# Patient Record
Sex: Male | Born: 1955 | Race: White | Hispanic: No | Marital: Married | Smoking: Never smoker
Health system: Southern US, Community
[De-identification: ages and names within clinical notes are randomized; demographics above are authoritative.]

## PROBLEM LIST (undated history)

## (undated) DIAGNOSIS — I10 Essential (primary) hypertension: Secondary | ICD-10-CM

## (undated) DIAGNOSIS — I872 Venous insufficiency (chronic) (peripheral): Secondary | ICD-10-CM

## (undated) DIAGNOSIS — T7840XA Allergy, unspecified, initial encounter: Secondary | ICD-10-CM

## (undated) DIAGNOSIS — E669 Obesity, unspecified: Secondary | ICD-10-CM

## (undated) HISTORY — DX: Essential (primary) hypertension: I10

## (undated) HISTORY — DX: Venous insufficiency (chronic) (peripheral): I87.2

## (undated) HISTORY — DX: Allergy, unspecified, initial encounter: T78.40XA

## (undated) HISTORY — DX: Obesity, unspecified: E66.9

---

## 2004-05-21 ENCOUNTER — Ambulatory Visit (HOSPITAL_COMMUNITY): Admission: RE | Admit: 2004-05-21 | Discharge: 2004-05-21 | Payer: Self-pay | Admitting: *Deleted

## 2006-09-27 IMAGING — CT CT ANGIO CHEST
4 of 8 series · 15 of 32 positions shown · IV contrast (120 ML OMNI 300)
Comparison: none

CLINICAL DATA: Recent echocardiogram showing questionable linear density in the aorta.  Episode of shortness of breath and diaphoresis.
 CT ANGIOGRAM OF THE CHEST:
TECHNIQUE: Multidetector CT imaging of the chest was performed according to the protocol for detection of pulmonary embolism during IV bolus injection of 120 ml Omnipaque 300 IV.  Coronal and sagittal plane reformatted images were also generated.

[Series 5: aortic disection · axial · 0.82mm/px · z∈[-230,-134]mm · 3 of 115 slices shown (1 of 2)]
[im 39/115  lung]
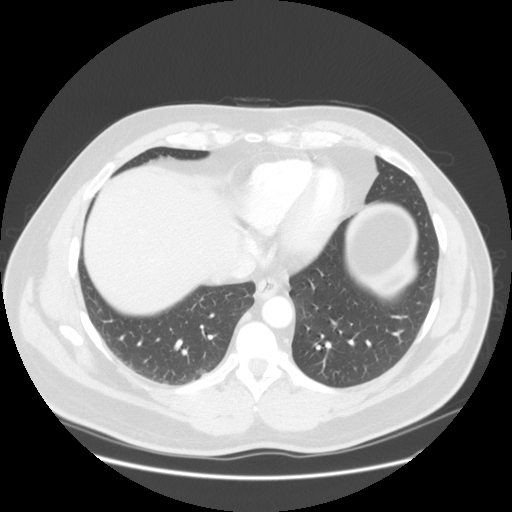
[im 54/115  lung]
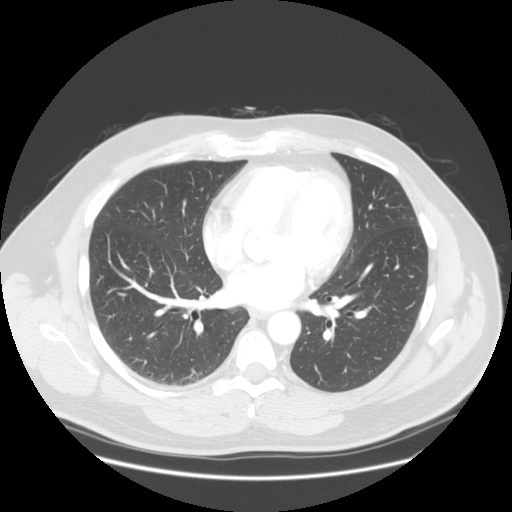
[im 77/115  lung]
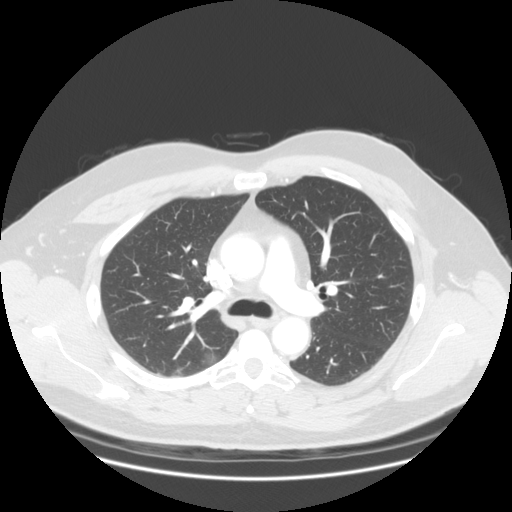

[Series 6: recon 2: aortic disection · axial · 0.70mm/px · z∈[-192,-150]mm · 2 of 89 slices shown]
[im 28/89  lung]
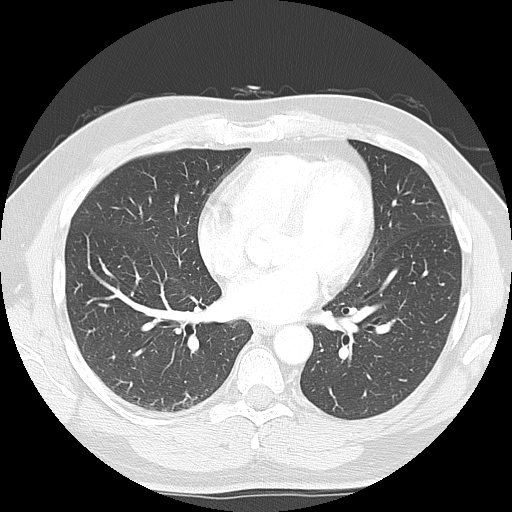
[im 45/89  lung]
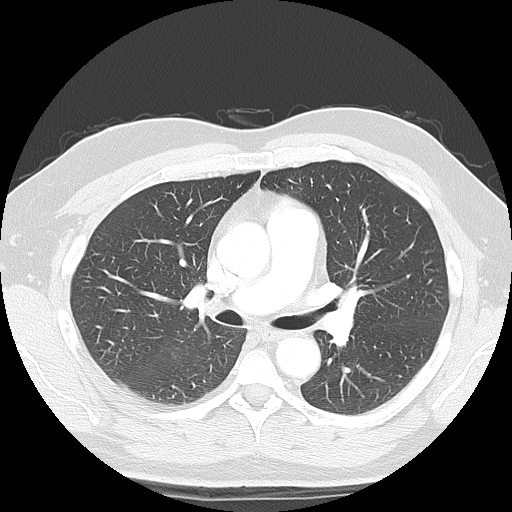

[Series 105: aortic disection · axial · 0.82mm/px · z∈[-286,-81]mm · 7 of 230 slices shown (2 of 2)]
[im 33/230  lung]
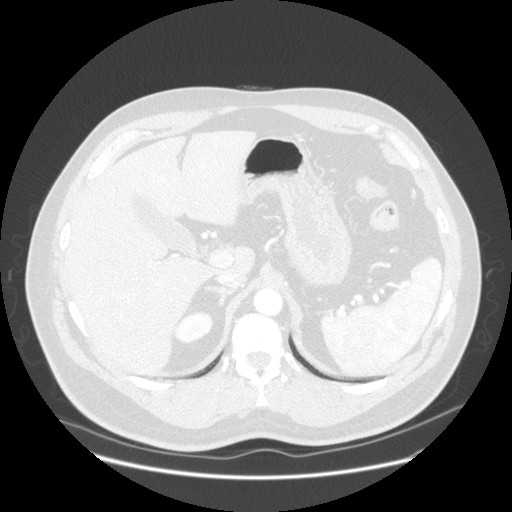
[im 66/230  mediastinal]
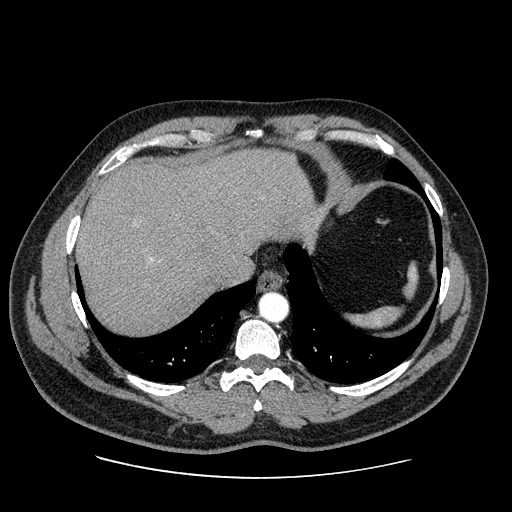
[im 99/230  lung]
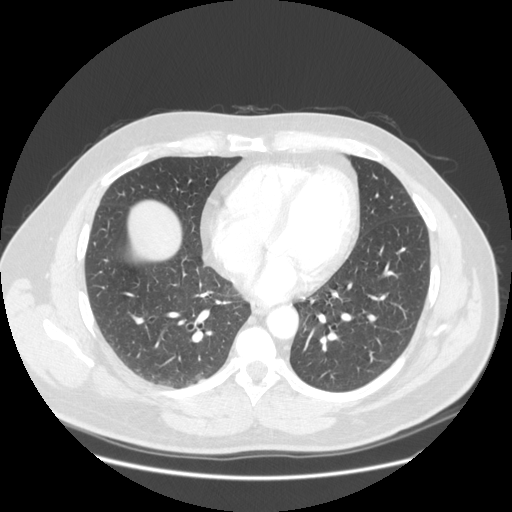
[im 108/230  mediastinal]
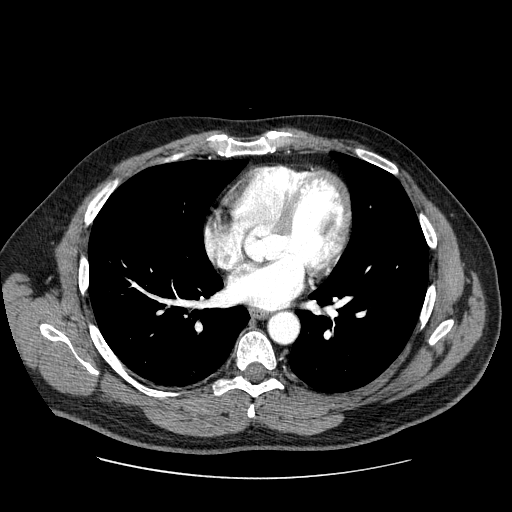
[im 131/230  lung]
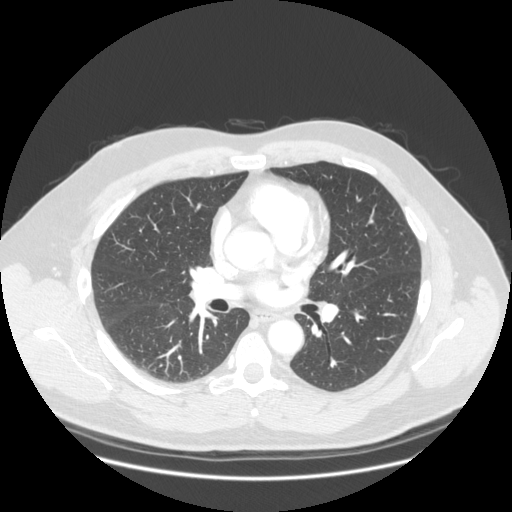
[im 164/230  mediastinal]
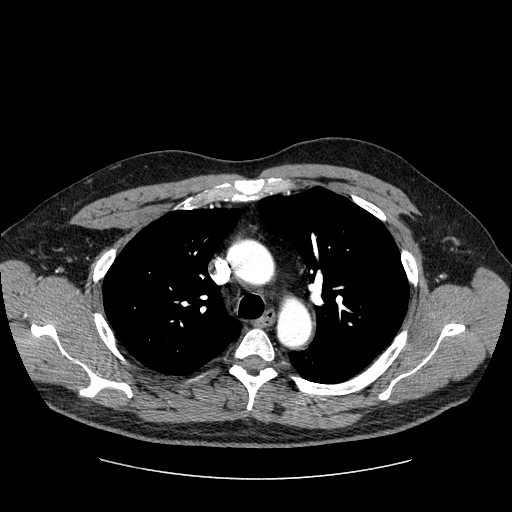
[im 197/230  lung]
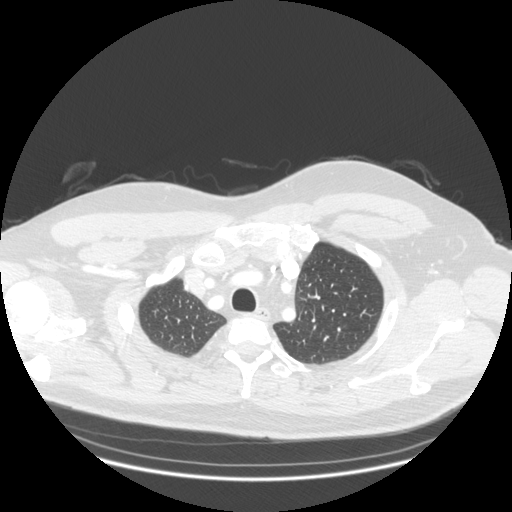

[Series 106: reformatted · sagittal · 0.47mm/px · 3 of 131 slices shown]
[im 33/131  lung]
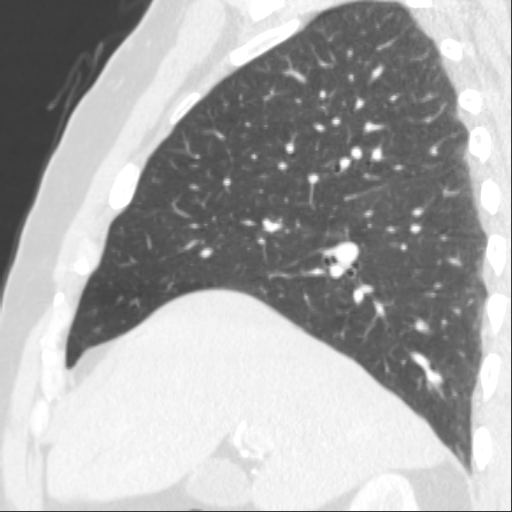
[im 66/131  lung]
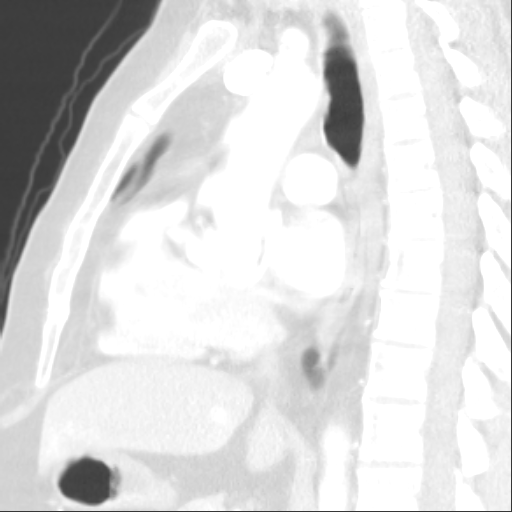
[im 98/131  lung]
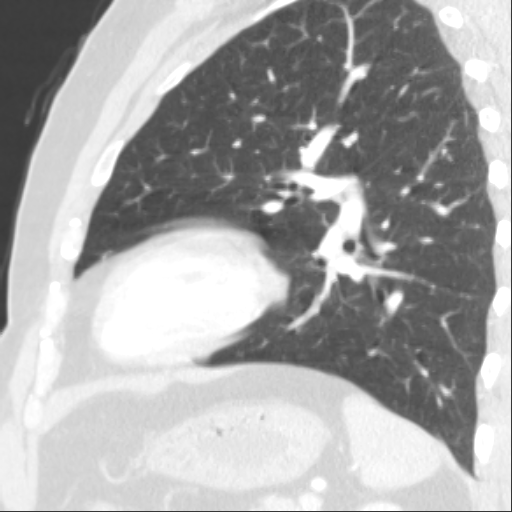

[15 of 32 positions shown; findings below may reference images not displayed]

FINDINGS: the lungs are clear.  No pleural or pericardial fluid.  No mediastinal or hilar adenopathy.  The aorta has a normal appearance without evidence of dissection.  No atherosclerotic change or aneurysm.  No abnormality of the pulmonary arterial tree.  Scans in the upper abdomen do not show any pathologic finding.
IMPRESSION: Negative CT angiogram of the chest.  No evidence of aortic pathology.  See above for full report.

## 2013-08-06 ENCOUNTER — Telehealth: Payer: Self-pay | Admitting: Internal Medicine

## 2013-10-21 ENCOUNTER — Telehealth: Payer: Self-pay

## 2013-10-21 NOTE — Telephone Encounter (Signed)
LM on his home VM to call me back.  Recall put into system.

## 2013-10-21 NOTE — Telephone Encounter (Signed)
Message copied by [place], Carloyn Lahue E on Thu Oct 21, 2013  4:25 PM ------      Message from: Silvano Rusk E      Created: Thu Oct 21, 2013  2:17 PM      Regarding: next colonoscopy due 07/2018       Please call patient and tell him I am sorry for dealy but good news - have reviewed his records and according to 2015 guidelines he does not need a colonoscopy again until 5/202 UNLESS he is having bleeding, changes in bowels, anemia, etc.            Please cc his PCP a copy of phone note after you call      Place 5./2020 recall            Thanks ------

## 2013-10-26 NOTE — Telephone Encounter (Signed)
Left message to call me back on his voice mail # [phone].

## 2013-11-01 NOTE — Telephone Encounter (Signed)
Left message to call me back on his mobile #, called work # and they said he was not in today.

## 2013-11-03 NOTE — Telephone Encounter (Signed)
Unable to reach patient after 3 try's so will mail him letter with the information Dr. Carlean Purl would like to inform him of.  Patient doesn't have a PCP listed to forward this information to.

## 2016-12-30 ENCOUNTER — Ambulatory Visit: Payer: Self-pay | Admitting: *Deleted

## 2016-12-30 VITALS — BP 157/95 | Ht 69.0 in | Wt 222.0 lb

## 2016-12-30 DIAGNOSIS — Z0000 Encounter for general adult medical examination without abnormal findings: Secondary | ICD-10-CM

## 2016-12-30 NOTE — Progress Notes (Signed)
Pt presented with labs drawn at pcp for be well. No Ht/Wt or BP documented. Obtained these in clinic today. BP elevated. Pt reports it typically runs 135-140 sys in MD office. No A1c drawn with labs. Due to BP out of passing range for [facility], A1c drawn today in clinic. Remainder of labs drawn at PCP in paper chart. Will call pt with results tomorrow.  [facility] insurance premium discount evaluation: Replacements ROI form signed. Tobacco Free Attestation form signed.  Forms placed in paper chart.

## 2016-12-31 LAB — HGB A1C W/O EAG: Hgb A1c MFr Bld: 5.4 % (ref 4.8–5.6)

## 2016-12-31 NOTE — Progress Notes (Signed)
Result reviewed with pt via email. DASH diet discussed at lab draw. F/u with pcp as scheduled.

## 2018-06-18 NOTE — Progress Notes (Signed)
noted 

## 2018-06-18 NOTE — Progress Notes (Signed)
Jackson Hinders, MD at [facility] is pcp. Annual labs performed through them and used for [facility]. Will request late 2019 lab results from pt for upcoming due [facility] for 2020.

## 2018-08-06 ENCOUNTER — Encounter: Payer: Self-pay | Admitting: Internal Medicine

## 2019-08-13 ENCOUNTER — Other Ambulatory Visit: Payer: Self-pay

## 2019-08-13 ENCOUNTER — Ambulatory Visit: Payer: No Typology Code available for payment source | Admitting: Internal Medicine

## 2019-08-13 ENCOUNTER — Encounter: Payer: Self-pay | Admitting: Internal Medicine

## 2019-08-13 VITALS — BP 120/70 | HR 83 | Temp 98.4°F | Ht 68.0 in | Wt 251.7 lb

## 2019-08-13 DIAGNOSIS — I872 Venous insufficiency (chronic) (peripheral): Secondary | ICD-10-CM | POA: Insufficient documentation

## 2019-08-13 DIAGNOSIS — Z1211 Encounter for screening for malignant neoplasm of colon: Secondary | ICD-10-CM

## 2019-08-13 DIAGNOSIS — E669 Obesity, unspecified: Secondary | ICD-10-CM | POA: Insufficient documentation

## 2019-08-13 DIAGNOSIS — I10 Essential (primary) hypertension: Secondary | ICD-10-CM | POA: Insufficient documentation

## 2019-08-13 MED ORDER — LOSARTAN POTASSIUM-HCTZ 100-25 MG PO TABS: 1.0000 | ORAL_TABLET | Freq: Every day | ORAL | 1 refills | Status: DC

## 2019-08-13 NOTE — Progress Notes (Signed)
New Patient Office Visit     This visit occurred during the SARS-CoV-2 public health emergency.  Safety protocols were in place, including screening questions prior to the visit, additional usage of staff PPE, and extensive cleaning of exam room while observing appropriate contact time as indicated for disinfecting solutions.    CC/Reason for Visit: Establish care, discuss chronic conditions, medication refills Previous PCP: Trilby Drummer Last Visit: 2 years ago  HPI: Jackson Alexander is a 64 y.o. male who is coming in today for the above mentioned reasons. Past Medical History is significant for: Well-controlled hypertension, chronic venous insufficiency and obesity.  He is here today to establish care.  He does not smoke, he drinks alcohol occasionally, no drugs, he is a call center supervisor, he is married to a man, he has no known drug allergies.  Past history, family history significant for maternal grandmother with breast cancer, maternal grandfather with prostate cancer, maternal aunt with breast cancer, maternal uncle with renal cell cancer, father was a smoker and had lung cancer, one brother had melanoma and another brother had renal cell cancer, 3 sisters had breast and uterine cancer.  No history of heart disease or stroke.  He would like me to evaluate a spot in his right leg.   Past Medical/Surgical History: Past Medical History:  Diagnosis Date  . Chronic venous insufficiency   . Hypertension   . Obesity (BMI 30-39.9)     History reviewed. No pertinent surgical history.  Social History:  reports that he has never smoked. He has never used smokeless tobacco. He reports current alcohol use. He reports that he does not use drugs.  Allergies: No Known Allergies  Family History:  Family History  Problem Relation Age of Onset  . Lung cancer Father   . Breast cancer Sister   . Renal cancer Brother   . Breast cancer Maternal Grandmother   . Prostate cancer Maternal  Grandfather   . Breast cancer Maternal Aunt   . Renal cancer Maternal Uncle   . Melanoma Brother   . Breast cancer Sister   . Breast cancer Sister      Current Outpatient Medications:  .  fexofenadine (ALLEGRA) 180 MG tablet, Take 180 mg by mouth daily., Disp: , Rfl:  .  losartan-hydrochlorothiazide (HYZAAR) 100-25 MG tablet, Take 1 tablet by mouth daily., Disp: 90 tablet, Rfl: 1 .  OVER THE COUNTER MEDICATION, Vitamin D3 50 mcg 2,000 iu per day, Disp: , Rfl:   Review of Systems:  Constitutional: Denies fever, chills, diaphoresis, appetite change and fatigue.  HEENT: Denies photophobia, eye pain, redness, hearing loss, ear pain, congestion, sore throat, rhinorrhea, sneezing, mouth sores, trouble swallowing, neck pain, neck stiffness and tinnitus.   Respiratory: Denies SOB, DOE, cough, chest tightness,  and wheezing.   Cardiovascular: Denies chest pain, palpitations and leg swelling.  Gastrointestinal: Denies nausea, vomiting, abdominal pain, diarrhea, constipation, blood in stool and abdominal distention.  Genitourinary: Denies dysuria, urgency, frequency, hematuria, flank pain and difficulty urinating.  Endocrine: Denies: hot or cold intolerance, sweats, changes in hair or nails, polyuria, polydipsia. Musculoskeletal: Denies myalgias, back pain, joint swelling, arthralgias and gait problem.  Skin: Denies pallor, rash and wound.  Neurological: Denies dizziness, seizures, syncope, weakness, light-headedness, numbness and headaches.  Hematological: Denies adenopathy. Easy bruising, personal or family bleeding history  Psychiatric/Behavioral: Denies suicidal ideation, mood changes, confusion, nervousness, sleep disturbance and agitation    Physical Exam: Vitals:   08/13/19 1118  BP: 120/70  Pulse: 83  Temp: 98.4 F (36.9 C)  TempSrc: Temporal  SpO2: 97%  Weight: 251 lb 11.2 oz (114.2 kg)  Height: 5\' 8"  (1.727 m)   Body mass index is 38.27 kg/m.  Constitutional: NAD, calm,  comfortable Eyes: PERRL, lids and conjunctivae normal ENMT: Mucous membranes are moist.  Respiratory: clear to auscultation bilaterally, no wheezing, no crackles. Normal respiratory effort. No accessory muscle use.  Cardiovascular: Regular rate and rhythm, no murmurs / rubs / gallops.  2+ pitting lower extremity edema. 2+ pedal pulses. Skin: Chronic lower extremity venous insufficiency skin color changes with an early excoriation on the outer right lower shin. Neurologic: Grossly intact and nonfocal Psychiatric: Normal judgment and insight. Alert and oriented x 3. Normal mood.    Impression and Plan:  Screening for malignant neoplasm of colon  - Plan: Ambulatory referral to Gastroenterology  Essential hypertension -Well-controlled, -With significant lower extremity edema we have decided to discontinue amlodipine today. -He will do ambulatory blood pressure monitoring and return in 6 weeks for follow-up.    Obesity (BMI 30-39.9) -Discussed healthy lifestyle, including increased physical activity and better food choices to promote weight loss.  Chronic venous insufficiency -Have recommended compression stockings.    Patient Instructions  -Nice seeing you today!!  -Stop taking amlodipine.  -Check BP 2-3 times a week and bring measurements into your next visit.  -Schedule follow up in 6 weeks.   DASH Eating Plan DASH stands for "Dietary Approaches to Stop Hypertension." The DASH eating plan is a healthy eating plan that has been shown to reduce high blood pressure (hypertension). It may also reduce your risk for type 2 diabetes, heart disease, and stroke. The DASH eating plan may also help with weight loss. What are tips for following this plan?  General guidelines  Avoid eating more than 2,300 mg (milligrams) of salt (sodium) a day. If you have hypertension, you may need to reduce your sodium intake to 1,500 mg a day.  Limit alcohol intake to no more than 1 drink a day for  nonpregnant women and 2 drinks a day for men. One drink equals 12 oz of beer, 5 oz of wine, or 1 oz of hard liquor.  Work with your health care provider to maintain a healthy body weight or to lose weight. Ask what an ideal weight is for you.  Get at least 30 minutes of exercise that causes your heart to beat faster (aerobic exercise) most days of the week. Activities may include walking, swimming, or biking.  Work with your health care provider or diet and nutrition specialist (dietitian) to adjust your eating plan to your individual calorie needs. Reading food labels   Check food labels for the amount of sodium per serving. Choose foods with less than 5 percent of the Daily Value of sodium. Generally, foods with less than 300 mg of sodium per serving fit into this eating plan.  To find whole grains, look for the word "whole" as the first word in the ingredient list. Shopping  Buy products labeled as "low-sodium" or "no salt added."  Buy fresh foods. Avoid canned foods and premade or frozen meals. Cooking  Avoid adding salt when cooking. Use salt-free seasonings or herbs instead of table salt or sea salt. Check with your health care provider or pharmacist before using salt substitutes.  Do not fry foods. Cook foods using healthy methods such as baking, boiling, grilling, and broiling instead.  Cook with heart-healthy oils, such as olive, canola, soybean, or sunflower oil. Meal planning  Eat a balanced diet that includes: ? 5 or more servings of fruits and vegetables each day. At each meal, try to fill half of your plate with fruits and vegetables. ? Up to 6-8 servings of whole grains each day. ? Less than 6 oz of lean meat, poultry, or fish each day. A 3-oz serving of meat is about the same size as a deck of cards. One egg equals 1 oz. ? 2 servings of low-fat dairy each day. ? A serving of nuts, seeds, or beans 5 times each week. ? Heart-healthy fats. Healthy fats called Omega-3  fatty acids are found in foods such as flaxseeds and coldwater fish, like sardines, salmon, and mackerel.  Limit how much you eat of the following: ? Canned or prepackaged foods. ? Food that is high in trans fat, such as fried foods. ? Food that is high in saturated fat, such as fatty meat. ? Sweets, desserts, sugary drinks, and other foods with added sugar. ? Full-fat dairy products.  Do not salt foods before eating.  Try to eat at least 2 vegetarian meals each week.  Eat more home-cooked food and less restaurant, buffet, and fast food.  When eating at a restaurant, ask that your food be prepared with less salt or no salt, if possible. What foods are recommended? The items listed may not be a complete list. Talk with your dietitian about what dietary choices are best for you. Grains Whole-grain or whole-wheat bread. Whole-grain or whole-wheat pasta. Brown rice. [place]. Bulgur. Whole-grain and low-sodium cereals. Pita bread. Low-fat, low-sodium crackers. Whole-wheat flour tortillas. Vegetables Fresh or frozen vegetables (raw, steamed, roasted, or grilled). Low-sodium or reduced-sodium tomato and vegetable juice. Low-sodium or reduced-sodium tomato sauce and tomato paste. Low-sodium or reduced-sodium canned vegetables. Fruits All fresh, dried, or frozen fruit. Canned fruit in natural juice (without added sugar). Meat and other protein foods Skinless chicken or [place]. Ground chicken or [place]. Pork with fat trimmed off. Fish and seafood. Egg whites. Dried beans, peas, or lentils. Unsalted nuts, nut butters, and seeds. Unsalted canned beans. Lean cuts of beef with fat trimmed off. Low-sodium, lean deli meat. Dairy Low-fat (1%) or fat-free (skim) milk. Fat-free, low-fat, or reduced-fat cheeses. Nonfat, low-sodium ricotta or cottage cheese. Low-fat or nonfat yogurt. Low-fat, low-sodium cheese. Fats and oils Soft margarine without trans fats. Vegetable oil. Low-fat, reduced-fat, or  light mayonnaise and salad dressings (reduced-sodium). Canola, safflower, olive, soybean, and sunflower oils. Avocado. Seasoning and other foods Herbs. Spices. Seasoning mixes without salt. Unsalted popcorn and pretzels. Fat-free sweets. What foods are not recommended? The items listed may not be a complete list. Talk with your dietitian about what dietary choices are best for you. Grains Baked goods made with fat, such as croissants, muffins, or some breads. Dry pasta or rice meal packs. Vegetables Creamed or fried vegetables. Vegetables in a cheese sauce. Regular canned vegetables (not low-sodium or reduced-sodium). Regular canned tomato sauce and paste (not low-sodium or reduced-sodium). Regular tomato and vegetable juice (not low-sodium or reduced-sodium). Angie Fava. Olives. Fruits Canned fruit in a light or heavy syrup. Fried fruit. Fruit in cream or butter sauce. Meat and other protein foods Fatty cuts of meat. Ribs. Fried meat. Berniece Salines. Sausage. Bologna and other processed lunch meats. Salami. Fatback. Hotdogs. Bratwurst. Salted nuts and seeds. Canned beans with added salt. Canned or smoked fish. Whole eggs or egg yolks. Chicken or [place] with skin. Dairy Whole or 2% milk, cream, and half-and-half. Whole or full-fat cream cheese. Whole-fat or sweetened yogurt.  Full-fat cheese. Nondairy creamers. Whipped toppings. Processed cheese and cheese spreads. Fats and oils Butter. Stick margarine. Lard. Shortening. Ghee. Bacon fat. Tropical oils, such as coconut, palm kernel, or palm oil. Seasoning and other foods Salted popcorn and pretzels. Onion salt, garlic salt, seasoned salt, table salt, and sea salt. Worcestershire sauce. Tartar sauce. Barbecue sauce. Teriyaki sauce. Soy sauce, including reduced-sodium. Steak sauce. Canned and packaged gravies. Fish sauce. Oyster sauce. Cocktail sauce. Horseradish that you find on the shelf. Ketchup. Mustard. Meat flavorings and tenderizers. Bouillon cubes. Hot  sauce and Tabasco sauce. Premade or packaged marinades. Premade or packaged taco seasonings. Relishes. Regular salad dressings. Where to find more information:  National Heart, Lung, and [place]: https://wilson-eaton.com/  American Heart Association: www.heart.org Summary  The DASH eating plan is a healthy eating plan that has been shown to reduce high blood pressure (hypertension). It may also reduce your risk for type 2 diabetes, heart disease, and stroke.  With the DASH eating plan, you should limit salt (sodium) intake to 2,300 mg a day. If you have hypertension, you may need to reduce your sodium intake to 1,500 mg a day.  When on the DASH eating plan, aim to eat more fresh fruits and vegetables, whole grains, lean proteins, low-fat dairy, and heart-healthy fats.  Work with your health care provider or diet and nutrition specialist (dietitian) to adjust your eating plan to your individual calorie needs. This information is not intended to replace advice given to you by your health care provider. Make sure you discuss any questions you have with your health care provider. Document Revised: 02/21/2017 Document Reviewed: 03/04/2016 Elsevier Patient Education  2020 [place], MD [facility]

## 2019-08-13 NOTE — Patient Instructions (Signed)
-Nice seeing you today!!  -Stop taking amlodipine.  -Check BP 2-3 times a week and bring measurements into your next visit.  -Schedule follow up in 6 weeks.   DASH Eating Plan DASH stands for "Dietary Approaches to Stop Hypertension." The DASH eating plan is a healthy eating plan that has been shown to reduce high blood pressure (hypertension). It may also reduce your risk for type 2 diabetes, heart disease, and stroke. The DASH eating plan may also help with weight loss. What are tips for following this plan?  General guidelines  Avoid eating more than 2,300 mg (milligrams) of salt (sodium) a day. If you have hypertension, you may need to reduce your sodium intake to 1,500 mg a day.  Limit alcohol intake to no more than 1 drink a day for nonpregnant women and 2 drinks a day for men. One drink equals 12 oz of beer, 5 oz of wine, or 1 oz of hard liquor.  Work with your health care provider to maintain a healthy body weight or to lose weight. Ask what an ideal weight is for you.  Get at least 30 minutes of exercise that causes your heart to beat faster (aerobic exercise) most days of the week. Activities may include walking, swimming, or biking.  Work with your health care provider or diet and nutrition specialist (dietitian) to adjust your eating plan to your individual calorie needs. Reading food labels   Check food labels for the amount of sodium per serving. Choose foods with less than 5 percent of the Daily Value of sodium. Generally, foods with less than 300 mg of sodium per serving fit into this eating plan.  To find whole grains, look for the word "whole" as the first word in the ingredient list. Shopping  Buy products labeled as "low-sodium" or "no salt added."  Buy fresh foods. Avoid canned foods and premade or frozen meals. Cooking  Avoid adding salt when cooking. Use salt-free seasonings or herbs instead of table salt or sea salt. Check with your health care provider  or pharmacist before using salt substitutes.  Do not fry foods. Cook foods using healthy methods such as baking, boiling, grilling, and broiling instead.  Cook with heart-healthy oils, such as olive, canola, soybean, or sunflower oil. Meal planning  Eat a balanced diet that includes: ? 5 or more servings of fruits and vegetables each day. At each meal, try to fill half of your plate with fruits and vegetables. ? Up to 6-8 servings of whole grains each day. ? Less than 6 oz of lean meat, poultry, or fish each day. A 3-oz serving of meat is about the same size as a deck of cards. One egg equals 1 oz. ? 2 servings of low-fat dairy each day. ? A serving of nuts, seeds, or beans 5 times each week. ? Heart-healthy fats. Healthy fats called Omega-3 fatty acids are found in foods such as flaxseeds and coldwater fish, like sardines, salmon, and mackerel.  Limit how much you eat of the following: ? Canned or prepackaged foods. ? Food that is high in trans fat, such as fried foods. ? Food that is high in saturated fat, such as fatty meat. ? Sweets, desserts, sugary drinks, and other foods with added sugar. ? Full-fat dairy products.  Do not salt foods before eating.  Try to eat at least 2 vegetarian meals each week.  Eat more home-cooked food and less restaurant, buffet, and fast food.  When eating at a restaurant, ask  that your food be prepared with less salt or no salt, if possible. What foods are recommended? The items listed may not be a complete list. Talk with your dietitian about what dietary choices are best for you. Grains Whole-grain or whole-wheat bread. Whole-grain or whole-wheat pasta. Brown rice. [place]. Bulgur. Whole-grain and low-sodium cereals. Pita bread. Low-fat, low-sodium crackers. Whole-wheat flour tortillas. Vegetables Fresh or frozen vegetables (raw, steamed, roasted, or grilled). Low-sodium or reduced-sodium tomato and vegetable juice. Low-sodium or  reduced-sodium tomato sauce and tomato paste. Low-sodium or reduced-sodium canned vegetables. Fruits All fresh, dried, or frozen fruit. Canned fruit in natural juice (without added sugar). Meat and other protein foods Skinless chicken or [place]. Ground chicken or [place]. Pork with fat trimmed off. Fish and seafood. Egg whites. Dried beans, peas, or lentils. Unsalted nuts, nut butters, and seeds. Unsalted canned beans. Lean cuts of beef with fat trimmed off. Low-sodium, lean deli meat. Dairy Low-fat (1%) or fat-free (skim) milk. Fat-free, low-fat, or reduced-fat cheeses. Nonfat, low-sodium ricotta or cottage cheese. Low-fat or nonfat yogurt. Low-fat, low-sodium cheese. Fats and oils Soft margarine without trans fats. Vegetable oil. Low-fat, reduced-fat, or light mayonnaise and salad dressings (reduced-sodium). Canola, safflower, olive, soybean, and sunflower oils. Avocado. Seasoning and other foods Herbs. Spices. Seasoning mixes without salt. Unsalted popcorn and pretzels. Fat-free sweets. What foods are not recommended? The items listed may not be a complete list. Talk with your dietitian about what dietary choices are best for you. Grains Baked goods made with fat, such as croissants, muffins, or some breads. Dry pasta or rice meal packs. Vegetables Creamed or fried vegetables. Vegetables in a cheese sauce. Regular canned vegetables (not low-sodium or reduced-sodium). Regular canned tomato sauce and paste (not low-sodium or reduced-sodium). Regular tomato and vegetable juice (not low-sodium or reduced-sodium). Angie Fava. Olives. Fruits Canned fruit in a light or heavy syrup. Fried fruit. Fruit in cream or butter sauce. Meat and other protein foods Fatty cuts of meat. Ribs. Fried meat. Berniece Salines. Sausage. Bologna and other processed lunch meats. Salami. Fatback. Hotdogs. Bratwurst. Salted nuts and seeds. Canned beans with added salt. Canned or smoked fish. Whole eggs or egg yolks. Chicken or [place]  with skin. Dairy Whole or 2% milk, cream, and half-and-half. Whole or full-fat cream cheese. Whole-fat or sweetened yogurt. Full-fat cheese. Nondairy creamers. Whipped toppings. Processed cheese and cheese spreads. Fats and oils Butter. Stick margarine. Lard. Shortening. Ghee. Bacon fat. Tropical oils, such as coconut, palm kernel, or palm oil. Seasoning and other foods Salted popcorn and pretzels. Onion salt, garlic salt, seasoned salt, table salt, and sea salt. Worcestershire sauce. Tartar sauce. Barbecue sauce. Teriyaki sauce. Soy sauce, including reduced-sodium. Steak sauce. Canned and packaged gravies. Fish sauce. Oyster sauce. Cocktail sauce. Horseradish that you find on the shelf. Ketchup. Mustard. Meat flavorings and tenderizers. Bouillon cubes. Hot sauce and Tabasco sauce. Premade or packaged marinades. Premade or packaged taco seasonings. Relishes. Regular salad dressings. Where to find more information:  National Heart, Lung, and [place]: https://wilson-eaton.com/  American Heart Association: www.heart.org Summary  The DASH eating plan is a healthy eating plan that has been shown to reduce high blood pressure (hypertension). It may also reduce your risk for type 2 diabetes, heart disease, and stroke.  With the DASH eating plan, you should limit salt (sodium) intake to 2,300 mg a day. If you have hypertension, you may need to reduce your sodium intake to 1,500 mg a day.  When on the DASH eating plan, aim to eat more fresh fruits  and vegetables, whole grains, lean proteins, low-fat dairy, and heart-healthy fats.  Work with your health care provider or diet and nutrition specialist (dietitian) to adjust your eating plan to your individual calorie needs. This information is not intended to replace advice given to you by your health care provider. Make sure you discuss any questions you have with your health care provider. Document Revised: 02/21/2017 Document Reviewed:  03/04/2016 Elsevier Patient Education  2020 [employer].

## 2019-09-16 ENCOUNTER — Encounter: Payer: Self-pay | Admitting: Internal Medicine

## 2019-10-06 ENCOUNTER — Encounter: Payer: Self-pay | Admitting: Internal Medicine

## 2019-10-06 ENCOUNTER — Other Ambulatory Visit: Payer: Self-pay

## 2019-10-06 ENCOUNTER — Ambulatory Visit (INDEPENDENT_AMBULATORY_CARE_PROVIDER_SITE_OTHER): Payer: PRIVATE HEALTH INSURANCE | Admitting: Internal Medicine

## 2019-10-06 VITALS — BP 150/90 | HR 83 | Temp 98.3°F | Ht 69.5 in | Wt 245.7 lb

## 2019-10-06 DIAGNOSIS — Z0000 Encounter for general adult medical examination without abnormal findings: Secondary | ICD-10-CM | POA: Diagnosis not present

## 2019-10-06 DIAGNOSIS — E669 Obesity, unspecified: Secondary | ICD-10-CM

## 2019-10-06 DIAGNOSIS — Z23 Encounter for immunization: Secondary | ICD-10-CM

## 2019-10-06 DIAGNOSIS — I10 Essential (primary) hypertension: Secondary | ICD-10-CM | POA: Diagnosis not present

## 2019-10-06 DIAGNOSIS — I872 Venous insufficiency (chronic) (peripheral): Secondary | ICD-10-CM | POA: Diagnosis not present

## 2019-10-06 MED ORDER — METOPROLOL TARTRATE 25 MG PO TABS: 25.0000 mg | ORAL_TABLET | Freq: Two times a day (BID) | ORAL | 1 refills | Status: DC

## 2019-10-06 NOTE — Addendum Note (Signed)
Addended by: Westley Hummer B on: 10/06/2019 03:59 PM   Modules accepted: Orders

## 2019-10-06 NOTE — Progress Notes (Signed)
Established Patient Office Visit     This visit occurred during the SARS-CoV-2 public health emergency.  Safety protocols were in place, including screening questions prior to the visit, additional usage of staff PPE, and extensive cleaning of exam room while observing appropriate contact time as indicated for disinfecting solutions.    CC/Reason for Visit: Annual preventive exam, blood pressure follow-up  HPI: Jackson Alexander is a 64 y.o. male who is coming in today for the above mentioned reasons. Past Medical History is significant for: Obesity, hypertension and chronic venous insufficiency.  At his last visit his amlodipine was discontinued due to lower extremity edema.  Blood pressure is elevated in office today to 150/90, however edema has completely resolved.  He does not have routine eye or dental care.  He does not exercise routinely.  He is due for his tetanus booster and shingles vaccination today.  He has his colonoscopy scheduled for August.  He has no complaints today.   Past Medical/Surgical History: Past Medical History:  Diagnosis Date  . Chronic venous insufficiency   . Hypertension   . Obesity (BMI 30-39.9)     No past surgical history on file.  Social History:  reports that he has never smoked. He has never used smokeless tobacco. He reports current alcohol use. He reports that he does not use drugs.  Allergies: No Known Allergies  Family History:  Family History  Problem Relation Age of Onset  . Lung cancer Father   . Breast cancer Sister   . Renal cancer Brother   . Breast cancer Maternal Grandmother   . Prostate cancer Maternal Grandfather   . Breast cancer Maternal Aunt   . Renal cancer Maternal Uncle   . Melanoma Brother   . Breast cancer Sister   . Breast cancer Sister      Current Outpatient Medications:  .  fexofenadine (ALLEGRA) 180 MG tablet, Take 180 mg by mouth daily., Disp: , Rfl:  .  losartan-hydrochlorothiazide (HYZAAR)  100-25 MG tablet, Take 1 tablet by mouth daily., Disp: 90 tablet, Rfl: 1 .  OVER THE COUNTER MEDICATION, Vitamin D3 50 mcg 2,000 iu per day, Disp: , Rfl:  .  metoprolol tartrate (LOPRESSOR) 25 MG tablet, Take 1 tablet (25 mg total) by mouth 2 (two) times daily., Disp: 180 tablet, Rfl: 1  Review of Systems:  Constitutional: Denies fever, chills, diaphoresis, appetite change and fatigue.  HEENT: Denies photophobia, eye pain, redness, hearing loss, ear pain, congestion, sore throat, rhinorrhea, sneezing, mouth sores, trouble swallowing, neck pain, neck stiffness and tinnitus.   Respiratory: Denies SOB, DOE, cough, chest tightness,  and wheezing.   Cardiovascular: Denies chest pain, palpitations and leg swelling.  Gastrointestinal: Denies nausea, vomiting, abdominal pain, diarrhea, constipation, blood in stool and abdominal distention.  Genitourinary: Denies dysuria, urgency, frequency, hematuria, flank pain and difficulty urinating.  Endocrine: Denies: hot or cold intolerance, sweats, changes in hair or nails, polyuria, polydipsia. Musculoskeletal: Denies myalgias, back pain, joint swelling, arthralgias and gait problem.  Skin: Denies pallor, rash and wound.  Neurological: Denies dizziness, seizures, syncope, weakness, light-headedness, numbness and headaches.  Hematological: Denies adenopathy. Easy bruising, personal or family bleeding history  Psychiatric/Behavioral: Denies suicidal ideation, mood changes, confusion, nervousness, sleep disturbance and agitation    Physical Exam: Vitals:   10/06/19 0756  BP: (!) 150/90  Pulse: 83  Temp: 98.3 F (36.8 C)  TempSrc: Temporal  SpO2: 94%  Weight: 245 lb 11.2 oz (111.4 kg)  Height: 5' 9.5" (1.765  m)    Body mass index is 35.76 kg/m.   Constitutional: NAD, calm, comfortable Eyes: PERRL, lids and conjunctivae normal ENMT: Mucous membranes are moist. Tympanic membrane is pearly white, no erythema or bulging. Neck: normal, supple, no  masses, no thyromegaly Respiratory: clear to auscultation bilaterally, no wheezing, no crackles. Normal respiratory effort. No accessory muscle use.  Cardiovascular: Regular rate and rhythm, no murmurs / rubs / gallops. No extremity edema. 2+ pedal pulses. No carotid bruits.  Abdomen: no tenderness, no masses palpated. No hepatosplenomegaly. Bowel sounds positive.  Musculoskeletal: no clubbing / cyanosis. No joint deformity upper and lower extremities. Good ROM, no contractures. Normal muscle tone.  Skin: Chronic venous insufficiency skin color changes of bilateral lower legs, worse on the right. Neurologic: CN 2-12 grossly intact. Sensation intact, DTR normal. Strength 5/5 in all 4.  Psychiatric: Normal judgment and insight. Alert and oriented x 3. Normal mood.    Impression and Plan:  Encounter for preventive health examination  -Advised routine eye and dental care. -For Tdap booster and first shingles vaccine today, otherwise immunizations are up-to-date. -Screening labs today. -Healthy lifestyle discussed in detail. -Scheduled for colonoscopy in August. -Check PSA today for prostate cancer screening.  Essential hypertension  -Not well controlled after discontinuation of amlodipine. -Continue losartan and hydrochlorothiazide at maximum doses. -Add metoprolol 25 mg twice daily. -Return in 6 weeks for follow-up and continue ambulatory blood pressure monitoring.  Obesity (BMI 30-39.9) -Discussed healthy lifestyle, including increased physical activity and better food choices to promote weight loss.  Chronic venous insufficiency -Compression stockings advised when standing/sitting for long periods of time.    Patient Instructions  -Nice seeing you today!!  -Lab work today; will notify you once results are available.  -Start metoprolol 25 mg twice daily.  -Check blood pressure at home 2-3 times per week and bring chart into your next visit.  -Schedule follow up in 6  weeks.  -Tetanus and first shingles vaccine today.  -Make sure you schedule your eye and dental exams.   Preventive Care 61-3 Years Old, Male Preventive care refers to lifestyle choices and visits with your health care provider that can promote health and wellness. This includes:  A yearly physical exam. This is also called an annual well check.  Regular dental and eye exams.  Immunizations.  Screening for certain conditions.  Healthy lifestyle choices, such as eating a healthy diet, getting regular exercise, not using drugs or products that contain nicotine and tobacco, and limiting alcohol use. What can I expect for my preventive care visit? Physical exam Your health care provider will check:  Height and weight. These may be used to calculate body mass index (BMI), which is a measurement that tells if you are at a healthy weight.  Heart rate and blood pressure.  Your skin for abnormal spots. Counseling Your health care provider may ask you questions about:  Alcohol, tobacco, and drug use.  Emotional well-being.  Home and relationship well-being.  Sexual activity.  Eating habits.  Work and work Statistician. What immunizations do I need?  Influenza (flu) vaccine  This is recommended every year. Tetanus, diphtheria, and pertussis (Tdap) vaccine  You may need a Td booster every 10 years. Varicella (chickenpox) vaccine  You may need this vaccine if you have not already been vaccinated. Zoster (shingles) vaccine  You may need this after age 57. Measles, mumps, and rubella (MMR) vaccine  You may need at least one dose of MMR if you were born in 1957 or  later. You may also need a second dose. Pneumococcal conjugate (PCV13) vaccine  You may need this if you have certain conditions and were not previously vaccinated. Pneumococcal polysaccharide (PPSV23) vaccine  You may need one or two doses if you smoke cigarettes or if you have certain  conditions. Meningococcal conjugate (MenACWY) vaccine  You may need this if you have certain conditions. Hepatitis A vaccine  You may need this if you have certain conditions or if you travel or work in places where you may be exposed to hepatitis A. Hepatitis B vaccine  You may need this if you have certain conditions or if you travel or work in places where you may be exposed to hepatitis B. Haemophilus influenzae type b (Hib) vaccine  You may need this if you have certain risk factors. Human papillomavirus (HPV) vaccine  If recommended by your health care provider, you may need three doses over 6 months. You may receive vaccines as individual doses or as more than one vaccine together in one shot (combination vaccines). Talk with your health care provider about the risks and benefits of combination vaccines. What tests do I need? Blood tests  Lipid and cholesterol levels. These may be checked every 5 years, or more frequently if you are over 64 years old.  Hepatitis C test.  Hepatitis B test. Screening  Lung cancer screening. You may have this screening every year starting at age 53 if you have a 30-pack-year history of smoking and currently smoke or have quit within the past 15 years.  Prostate cancer screening. Recommendations will vary depending on your family history and other risks.  Colorectal cancer screening. All adults should have this screening starting at age 55 and continuing until age 59. Your health care provider may recommend screening at age 22 if you are at increased risk. You will have tests every 1-10 years, depending on your results and the type of screening test.  Diabetes screening. This is done by checking your blood sugar (glucose) after you have not eaten for a while (fasting). You may have this done every 1-3 years.  Sexually transmitted disease (STD) testing. Follow these instructions at home: Eating and drinking  Eat a diet that includes fresh  fruits and vegetables, whole grains, lean protein, and low-fat dairy products.  Take vitamin and mineral supplements as recommended by your health care provider.  Do not drink alcohol if your health care provider tells you not to drink.  If you drink alcohol: ? Limit how much you have to 0-2 drinks a day. ? Be aware of how much alcohol is in your drink. In the [place] one drink equals one 12 oz bottle of beer (355 mL), one 5 oz glass of wine (148 mL), or one 1 oz glass of hard liquor (44 mL). Lifestyle  Take daily care of your teeth and gums.  Stay active. Exercise for at least 30 minutes on 5 or more days each week.  Do not use any products that contain nicotine or tobacco, such as cigarettes, e-cigarettes, and chewing tobacco. If you need help quitting, ask your health care provider.  If you are sexually active, practice safe sex. Use a condom or other form of protection to prevent STIs (sexually transmitted infections).  Talk with your health care provider about taking a low-dose aspirin every day starting at age 26. What's next?  Go to your health care provider once a year for a well check visit.  Ask your health care provider how often  you should have your eyes and teeth checked.  Stay up to date on all vaccines. This information is not intended to replace advice given to you by your health care provider. Make sure you discuss any questions you have with your health care provider. Document Revised: 03/05/2018 Document Reviewed: 03/05/2018 Elsevier Patient Education  2020 [place], [place] [facility]

## 2019-10-06 NOTE — Addendum Note (Signed)
Addended by: Marrion Coy on: 10/06/2019 08:33 AM   Modules accepted: Orders

## 2019-10-06 NOTE — Patient Instructions (Signed)
-Nice seeing you today!!  -Lab work today; will notify you once results are available.  -Start metoprolol 25 mg twice daily.  -Check blood pressure at home 2-3 times per week and bring chart into your next visit.  -Schedule follow up in 6 weeks.  -Tetanus and first shingles vaccine today.  -Make sure you schedule your eye and dental exams.   Preventive Care 64-64 Years Old, Male Preventive care refers to lifestyle choices and visits with your health care provider that can promote health and wellness. This includes:  A yearly physical exam. This is also called an annual well check.  Regular dental and eye exams.  Immunizations.  Screening for certain conditions.  Healthy lifestyle choices, such as eating a healthy diet, getting regular exercise, not using drugs or products that contain nicotine and tobacco, and limiting alcohol use. What can I expect for my preventive care visit? Physical exam Your health care provider will check:  Height and weight. These may be used to calculate body mass index (BMI), which is a measurement that tells if you are at a healthy weight.  Heart rate and blood pressure.  Your skin for abnormal spots. Counseling Your health care provider may ask you questions about:  Alcohol, tobacco, and drug use.  Emotional well-being.  Home and relationship well-being.  Sexual activity.  Eating habits.  Work and work Statistician. What immunizations do I need?  Influenza (flu) vaccine  This is recommended every year. Tetanus, diphtheria, and pertussis (Tdap) vaccine  You may need a Td booster every 10 years. Varicella (chickenpox) vaccine  You may need this vaccine if you have not already been vaccinated. Zoster (shingles) vaccine  You may need this after age 64. Measles, mumps, and rubella (MMR) vaccine  You may need at least one dose of MMR if you were born in 1957 or later. You may also need a second dose. Pneumococcal conjugate  (PCV13) vaccine  You may need this if you have certain conditions and were not previously vaccinated. Pneumococcal polysaccharide (PPSV23) vaccine  You may need one or two doses if you smoke cigarettes or if you have certain conditions. Meningococcal conjugate (MenACWY) vaccine  You may need this if you have certain conditions. Hepatitis A vaccine  You may need this if you have certain conditions or if you travel or work in places where you may be exposed to hepatitis A. Hepatitis B vaccine  You may need this if you have certain conditions or if you travel or work in places where you may be exposed to hepatitis B. Haemophilus influenzae type b (Hib) vaccine  You may need this if you have certain risk factors. Human papillomavirus (HPV) vaccine  If recommended by your health care provider, you may need three doses over 6 months. You may receive vaccines as individual doses or as more than one vaccine together in one shot (combination vaccines). Talk with your health care provider about the risks and benefits of combination vaccines. What tests do I need? Blood tests  Lipid and cholesterol levels. These may be checked every 5 years, or more frequently if you are over 64 years old.  Hepatitis C test.  Hepatitis B test. Screening  Lung cancer screening. You may have this screening every year starting at age 64 if you have a 30-pack-year history of smoking and currently smoke or have quit within the past 15 years.  Prostate cancer screening. Recommendations will vary depending on your family history and other risks.  Colorectal cancer screening.  All adults should have this screening starting at age 36 and continuing until age 60. Your health care provider may recommend screening at age 57 if you are at increased risk. You will have tests every 1-10 years, depending on your results and the type of screening test.  Diabetes screening. This is done by checking your blood sugar (glucose)  after you have not eaten for a while (fasting). You may have this done every 1-3 years.  Sexually transmitted disease (STD) testing. Follow these instructions at home: Eating and drinking  Eat a diet that includes fresh fruits and vegetables, whole grains, lean protein, and low-fat dairy products.  Take vitamin and mineral supplements as recommended by your health care provider.  Do not drink alcohol if your health care provider tells you not to drink.  If you drink alcohol: ? Limit how much you have to 0-2 drinks a day. ? Be aware of how much alcohol is in your drink. In the [place] one drink equals one 12 oz bottle of beer (355 mL), one 5 oz glass of wine (148 mL), or one 1 oz glass of hard liquor (44 mL). Lifestyle  Take daily care of your teeth and gums.  Stay active. Exercise for at least 30 minutes on 5 or more days each week.  Do not use any products that contain nicotine or tobacco, such as cigarettes, e-cigarettes, and chewing tobacco. If you need help quitting, ask your health care provider.  If you are sexually active, practice safe sex. Use a condom or other form of protection to prevent STIs (sexually transmitted infections).  Talk with your health care provider about taking a low-dose aspirin every day starting at age 35. What's next?  Go to your health care provider once a year for a well check visit.  Ask your health care provider how often you should have your eyes and teeth checked.  Stay up to date on all vaccines. This information is not intended to replace advice given to you by your health care provider. Make sure you discuss any questions you have with your health care provider. Document Revised: 03/05/2018 Document Reviewed: 03/05/2018 Elsevier Patient Education  2020 [employer].

## 2019-10-07 LAB — CBC WITH DIFFERENTIAL/PLATELET
Absolute Monocytes: 312 cells/uL (ref 200–950)
Basophils Absolute: 21 cells/uL (ref 0–200)
Basophils Relative: 0.4 %
Eosinophils Absolute: 83 cells/uL (ref 15–500)
Eosinophils Relative: 1.6 %
HCT: 48.3 % (ref 38.5–50.0)
Hemoglobin: 16.4 g/dL (ref 13.2–17.1)
Lymphs Abs: 1799 cells/uL (ref 850–3900)
MCH: 28.7 pg (ref 27.0–33.0)
MCHC: 34 g/dL (ref 32.0–36.0)
MCV: 84.6 fL (ref 80.0–100.0)
MPV: 8.5 fL (ref 7.5–12.5)
Monocytes Relative: 6 %
Neutro Abs: 2985 cells/uL (ref 1500–7800)
Neutrophils Relative %: 57.4 %
Platelets: 151 10*3/uL (ref 140–400)
RBC: 5.71 10*6/uL (ref 4.20–5.80)
RDW: 13.8 % (ref 11.0–15.0)
Total Lymphocyte: 34.6 %
WBC: 5.2 10*3/uL (ref 3.8–10.8)

## 2019-10-07 LAB — LIPID PANEL
Cholesterol: 154 mg/dL (ref <200)
HDL: 40 mg/dL (ref >=40)
LDL Cholesterol (Calc): 92 mg/dL (calc)
Non-HDL Cholesterol (Calc): 114 mg/dL (calc) (ref <130)
Total CHOL/HDL Ratio: 3.9 (calc) (ref <5.0)
Triglycerides: 120 mg/dL (ref <150)

## 2019-10-07 LAB — COMPREHENSIVE METABOLIC PANEL
AG Ratio: 2.4 (calc) (ref 1.0–2.5)
ALT: 20 U/L (ref 9–46)
AST: 17 U/L (ref 10–35)
Albumin: 4.6 g/dL (ref 3.6–5.1)
Alkaline phosphatase (APISO): 45 U/L (ref 35–144)
BUN: 16 mg/dL (ref 7–25)
CO2: 28 mmol/L (ref 20–32)
Calcium: 9.2 mg/dL (ref 8.6–10.3)
Chloride: 102 mmol/L (ref 98–110)
Creat: 1.1 mg/dL (ref 0.70–1.25)
Globulin: 1.9 g/dL (calc) (ref 1.9–3.7)
Glucose, Bld: 112 mg/dL — ABNORMAL HIGH (ref 65–99)
Potassium: 3.6 mmol/L (ref 3.5–5.3)
Sodium: 139 mmol/L (ref 135–146)
Total Bilirubin: 0.5 mg/dL (ref 0.2–1.2)
Total Protein: 6.5 g/dL (ref 6.1–8.1)

## 2019-10-07 LAB — HEMOGLOBIN A1C
Hgb A1c MFr Bld: 5.2 % of total Hgb (ref <5.7)
Mean Plasma Glucose: 103 (calc)
eAG (mmol/L): 5.7 (calc)

## 2019-10-07 LAB — TSH: TSH: 1.27 mIU/L (ref 0.40–4.50)

## 2019-10-07 LAB — VITAMIN D 25 HYDROXY (VIT D DEFICIENCY, FRACTURES): Vit D, 25-Hydroxy: 43 ng/mL (ref 30–100)

## 2019-10-07 LAB — PSA: PSA: 1.2 ng/mL (ref <=4.0)

## 2019-10-07 LAB — VITAMIN B12: Vitamin B-12: 312 pg/mL (ref 200–1100)

## 2019-10-22 ENCOUNTER — Ambulatory Visit (AMBULATORY_SURGERY_CENTER): Payer: Self-pay | Admitting: *Deleted

## 2019-10-22 ENCOUNTER — Other Ambulatory Visit: Payer: Self-pay

## 2019-10-22 VITALS — Ht 69.5 in | Wt 245.0 lb

## 2019-10-22 DIAGNOSIS — Z1211 Encounter for screening for malignant neoplasm of colon: Secondary | ICD-10-CM

## 2019-10-22 NOTE — Progress Notes (Signed)
Patient is here in-person for PV. Patient denies any allergies to eggs or soy. Patient denies any problems with anesthesia/sedation. Patient denies any oxygen use at home. Patient denies taking any diet/weight loss medications or blood thinners. Patient is not being treated for MRSA or C-diff. Patient is aware of our care-partner policy and AESLP-53 safety protocol. EMMI education assisgned to the patient for the procedure, this was explained and instructions given to patient.   COVID-19 vaccines completed 07/12/19. Marland Kitchen

## 2019-11-04 ENCOUNTER — Encounter: Payer: Self-pay | Admitting: Certified Registered Nurse Anesthetist

## 2019-11-05 ENCOUNTER — Ambulatory Visit (AMBULATORY_SURGERY_CENTER): Payer: PRIVATE HEALTH INSURANCE | Admitting: Internal Medicine

## 2019-11-05 ENCOUNTER — Other Ambulatory Visit: Payer: Self-pay

## 2019-11-05 ENCOUNTER — Encounter: Payer: Self-pay | Admitting: Internal Medicine

## 2019-11-05 VITALS — BP 145/88 | HR 66 | Temp 98.4°F | Resp 18 | Ht 69.0 in | Wt 245.0 lb

## 2019-11-05 DIAGNOSIS — D124 Benign neoplasm of descending colon: Secondary | ICD-10-CM

## 2019-11-05 DIAGNOSIS — D125 Benign neoplasm of sigmoid colon: Secondary | ICD-10-CM

## 2019-11-05 DIAGNOSIS — Z1211 Encounter for screening for malignant neoplasm of colon: Secondary | ICD-10-CM | POA: Diagnosis present

## 2019-11-05 MED ORDER — SODIUM CHLORIDE 0.9 % IV SOLN: 500.0000 mL | Freq: Once | INTRAVENOUS | Status: DC

## 2019-11-05 NOTE — Patient Instructions (Addendum)
I found and removed 2 tiny polyps. You also have a condition called diverticulosis - common and not usually a problem. Please read the handout provided.  I will let you know pathology results and when to have another routine colonoscopy by mail and/or My Chart.  I appreciate the opportunity to care for you. Gatha Mayer, MD, [facility]   Handouts on polyps & diverticulosis given to you today  Await pathology results on polyps removed     YOU HAD AN ENDOSCOPIC PROCEDURE TODAY AT [facility]:   Refer to the procedure report that was given to you for any specific questions about what was found during the examination.  If the procedure report does not answer your questions, please call your gastroenterologist to clarify.  If you requested that your care partner not be given the details of your procedure findings, then the procedure report has been included in a sealed envelope for you to review at your convenience later.  YOU SHOULD EXPECT: Some feelings of bloating in the abdomen. Passage of more gas than usual.  Walking can help get rid of the air that was put into your GI tract during the procedure and reduce the bloating. If you had a lower endoscopy (such as a colonoscopy or flexible sigmoidoscopy) you may notice spotting of blood in your stool or on the toilet paper. If you underwent a bowel prep for your procedure, you may not have a normal bowel movement for a few days.  Please Note:  You might notice some irritation and congestion in your nose or some drainage.  This is from the oxygen used during your procedure.  There is no need for concern and it should clear up in a day or so.  SYMPTOMS TO REPORT IMMEDIATELY:   Following lower endoscopy (colonoscopy or flexible sigmoidoscopy):  Excessive amounts of blood in the stool  Significant tenderness or worsening of abdominal pains  Swelling of the abdomen that is new, acute  Fever of 100F or higher    For urgent or  emergent issues, a gastroenterologist can be reached at any hour by calling [phone]. Do not use MyChart messaging for urgent concerns.    DIET:  We do recommend a small meal at first, but then you may proceed to your regular diet.  Drink plenty of fluids but you should avoid alcoholic beverages for 24 hours.  ACTIVITY:  You should plan to take it easy for the rest of today and you should NOT DRIVE or use heavy machinery until tomorrow (because of the sedation medicines used during the test).    FOLLOW UP: Our staff will call the number listed on your records 48-72 hours following your procedure to check on you and address any questions or concerns that you may have regarding the information given to you following your procedure. If we do not reach you, we will leave a message.  We will attempt to reach you two times.  During this call, we will ask if you have developed any symptoms of COVID 19. If you develop any symptoms (ie: fever, flu-like symptoms, shortness of breath, cough etc.) before then, please call [phone].  If you test positive for Covid 19 in the 2 weeks post procedure, please call and report this information to [place].    If any biopsies were taken you will be contacted by phone or by letter within the next 1-3 weeks.  Please call us at [phone] if you have not heard about  the biopsies in 3 weeks.    SIGNATURES/CONFIDENTIALITY: You and/or your care partner have signed paperwork which will be entered into your electronic medical record.  These signatures attest to the fact that that the information above on your After Visit Summary has been reviewed and is understood.  Full responsibility of the confidentiality of this discharge information lies with you and/or your care-partner.

## 2019-11-05 NOTE — Progress Notes (Signed)
Pt's states no medical or surgical changes since previsit or office visit. 

## 2019-11-05 NOTE — Op Note (Signed)
[facility] Patient Name: Jackson Alexander Procedure Date: 11/05/2019 1:35 PM MRN: [id number] Endoscopist: Gatha Mayer , MD Age: 64 Referring MD:  Date of Birth: [birth year] Gender: Male Account #: [id number] Procedure:                Colonoscopy Indications:              Screening for colorectal malignant neoplasm Medicines:                Propofol per Anesthesia, Monitored Anesthesia Care Procedure:                Pre-Anesthesia Assessment:                           - Prior to the procedure, a History and Physical                            was performed, and patient medications and                            allergies were reviewed. The patient's tolerance of                            previous anesthesia was also reviewed. The risks                            and benefits of the procedure and the sedation                            options and risks were discussed with the patient.                            All questions were answered, and informed consent                            was obtained. Prior Anticoagulants: The patient has                            taken no previous anticoagulant or antiplatelet                            agents. ASA Grade Assessment: II - A patient with                            mild systemic disease. After reviewing the risks                            and benefits, the patient was deemed in                            satisfactory condition to undergo the procedure.                           After obtaining informed consent, the colonoscope  was passed under direct vision. Throughout the                            procedure, the patient's blood pressure, pulse, and                            oxygen saturations were monitored continuously. The                            Colonoscope was introduced through the anus and                            advanced to the the cecum, identified by                             appendiceal orifice and ileocecal valve. The                            colonoscopy was performed without difficulty. The                            patient tolerated the procedure well. The quality                            of the bowel preparation was good. The bowel                            preparation used was Miralax via split dose                            instruction. The ileocecal valve, appendiceal                            orifice, and rectum were photographed. Scope In: 1:44:10 PM Scope Out: 1:59:27 PM Scope Withdrawal Time: 0 hours 10 minutes 54 seconds  Total Procedure Duration: 0 hours 15 minutes 17 seconds  Findings:                 The perianal and digital rectal examinations were                            normal. Pertinent negatives include normal prostate                            (size, shape, and consistency).                           Two sessile polyps were found in the sigmoid colon                            and descending colon. The polyps were diminutive in                            size. These polyps were removed with a cold snare.  Resection and retrieval were complete. Verification                            of patient identification for the specimen was                            done. Estimated blood loss was minimal.                           Multiple small and large-mouthed diverticula were                            found in the sigmoid colon and descending colon.                           The exam was otherwise without abnormality on                            direct and retroflexion views. Complications:            No immediate complications. Estimated Blood Loss:     Estimated blood loss was minimal. Impression:               - Two diminutive polyps in the sigmoid colon and in                            the descending colon, removed with a cold snare.                            Resected and retrieved.                            - Diverticulosis in the sigmoid colon and in the                            descending colon.                           - The examination was otherwise normal on direct                            and retroflexion views. Recommendation:           - Patient has a contact number available for                            emergencies. The signs and symptoms of potential                            delayed complications were discussed with the                            patient. Return to normal activities tomorrow.                            Written discharge instructions were  provided to the                            patient.                           - Resume previous diet.                           - Continue present medications.                           - Repeat colonoscopy is recommended. The                            colonoscopy date will be determined after pathology                            results from today's exam become available for                            review. Gatha Mayer, MD 11/05/2019 2:08:11 PM This report has been signed electronically.

## 2019-11-05 NOTE — Progress Notes (Signed)
Report given to PACU, vss 

## 2019-11-05 NOTE — Progress Notes (Signed)
Called to room to assist during endoscopic procedure.  Patient ID and intended procedure confirmed with present staff. Received instructions for my participation in the procedure from the performing physician.  

## 2019-11-09 ENCOUNTER — Telehealth: Payer: Self-pay

## 2019-11-09 ENCOUNTER — Telehealth: Payer: Self-pay | Admitting: *Deleted

## 2019-11-09 NOTE — Telephone Encounter (Signed)
Attempted f/u phone call. No answer. Left message. °

## 2019-11-09 NOTE — Telephone Encounter (Signed)
No answer, unable to leave a message, B.Dorsie Burich RN. 

## 2019-11-14 ENCOUNTER — Encounter: Payer: Self-pay | Admitting: Internal Medicine

## 2019-11-14 DIAGNOSIS — Z860101 Personal history of adenomatous and serrated colon polyps: Secondary | ICD-10-CM | POA: Insufficient documentation

## 2019-11-14 DIAGNOSIS — Z86010 Personal history of colonic polyps: Secondary | ICD-10-CM

## 2019-11-14 HISTORY — DX: Personal history of adenomatous and serrated colon polyps: Z86.0101

## 2019-11-14 HISTORY — DX: Personal history of colonic polyps: Z86.010

## 2019-12-01 ENCOUNTER — Ambulatory Visit: Payer: PRIVATE HEALTH INSURANCE | Admitting: Internal Medicine

## 2019-12-01 ENCOUNTER — Encounter: Payer: Self-pay | Admitting: Internal Medicine

## 2019-12-01 ENCOUNTER — Other Ambulatory Visit: Payer: Self-pay

## 2019-12-01 VITALS — BP 150/90 | HR 74 | Temp 98.2°F | Wt 247.0 lb

## 2019-12-01 DIAGNOSIS — I10 Essential (primary) hypertension: Secondary | ICD-10-CM

## 2019-12-01 DIAGNOSIS — Z23 Encounter for immunization: Secondary | ICD-10-CM

## 2019-12-01 MED ORDER — METOPROLOL TARTRATE 50 MG PO TABS: 50.0000 mg | ORAL_TABLET | Freq: Two times a day (BID) | ORAL | 1 refills | Status: DC

## 2019-12-01 NOTE — Progress Notes (Signed)
Established Patient Office Visit     This visit occurred during the SARS-CoV-2 public health emergency.  Safety protocols were in place, including screening questions prior to the visit, additional usage of staff PPE, and extensive cleaning of exam room while observing appropriate contact time as indicated for disinfecting solutions.    CC/Reason for Visit: BP follow up  HPI: Jackson Alexander is a 64 y.o. male who is coming in today for the above mentioned reasons. Past Medical History is significant for: Obesity, hypertension and chronic venous insufficiency. He has been taken off amlodipine due to lower extremity edema. He is on max losartan/HCTZ dosing. He was started on metoprolol 25 mg BID last visit. States BP at home remains elevated around 140-150/80-90. He is not adding salt to his foods. He is tolerating new medication well.  Past Medical/Surgical History: Past Medical History:  Diagnosis Date  . Chronic venous insufficiency   . Hx of adenomatous polyp of colon 11/14/2019   10/2019 diminutive adenoma recall 2028  . Hypertension   . Obesity (BMI 30-39.9)     Past Surgical History:  Procedure Laterality Date  . COLONOSCOPY  07/26/2008   Dr.Orr-hyperplastic  polyps    Social History:  reports that he has never smoked. He has never used smokeless tobacco. He reports current alcohol use of about 1.0 standard drink of alcohol per week. He reports that he does not use drugs.  Allergies: No Known Allergies  Family History:  Family History  Problem Relation Age of Onset  . Lung cancer Father   . Breast cancer Sister   . Colon polyps Sister   . Renal cancer Brother   . Colon polyps Brother   . Breast cancer Maternal Grandmother   . Prostate cancer Maternal Grandfather   . Breast cancer Maternal Aunt   . Renal cancer Maternal Uncle   . Melanoma Brother   . Breast cancer Sister   . Breast cancer Sister   . Colon cancer Neg Hx   . Esophageal cancer Neg Hx   .  Stomach cancer Neg Hx   . Rectal cancer Neg Hx      Current Outpatient Medications:  .  fexofenadine (ALLEGRA) 180 MG tablet, Take 180 mg by mouth daily., Disp: , Rfl:  .  losartan-hydrochlorothiazide (HYZAAR) 100-25 MG tablet, Take 1 tablet by mouth daily., Disp: 90 tablet, Rfl: 1 .  metoprolol tartrate (LOPRESSOR) 50 MG tablet, Take 1 tablet (50 mg total) by mouth 2 (two) times daily., Disp: 180 tablet, Rfl: 1 .  OVER THE COUNTER MEDICATION, Vitamin D3 50 mcg 2,000 iu per day, Disp: , Rfl:   Review of Systems:  Constitutional: Denies fever, chills, diaphoresis, appetite change and fatigue.  HEENT: Denies photophobia, eye pain, redness, hearing loss, ear pain, congestion, sore throat, rhinorrhea, sneezing, mouth sores, trouble swallowing, neck pain, neck stiffness and tinnitus.   Respiratory: Denies SOB, DOE, cough, chest tightness,  and wheezing.   Cardiovascular: Denies chest pain, palpitations and leg swelling.  Gastrointestinal: Denies nausea, vomiting, abdominal pain, diarrhea, constipation, blood in stool and abdominal distention.  Genitourinary: Denies dysuria, urgency, frequency, hematuria, flank pain and difficulty urinating.  Endocrine: Denies: hot or cold intolerance, sweats, changes in hair or nails, polyuria, polydipsia. Musculoskeletal: Denies myalgias, back pain, joint swelling, arthralgias and gait problem.  Skin: Denies pallor, rash and wound.  Neurological: Denies dizziness, seizures, syncope, weakness, light-headedness, numbness and headaches.  Hematological: Denies adenopathy. Easy bruising, personal or family bleeding history  Psychiatric/Behavioral: Denies  suicidal ideation, mood changes, confusion, nervousness, sleep disturbance and agitation    Physical Exam: Vitals:   12/01/19 0756  BP: (!) 150/90  Pulse: 74  Temp: 98.2 F (36.8 C)  TempSrc: Oral  SpO2: 97%  Weight: 247 lb (112 kg)    Body mass index is 36.48 kg/m.   Constitutional: NAD, calm,  comfortable Eyes: PERRL, lids and conjunctivae normal ENMT: Mucous membranes are moist.  Respiratory: clear to auscultation bilaterally, no wheezing, no crackles. Normal respiratory effort. No accessory muscle use.  Cardiovascular: Regular rate and rhythm, no murmurs / rubs / gallops. No extremity edema.  Abdomen: no tenderness, no masses palpated. No hepatosplenomegaly. Bowel sounds positive.  Neurologic: grossly intact and non-focal Psychiatric: Normal judgment and insight. Alert and oriented x 3. Normal mood.    Impression and Plan:  Essential hypertension  -BP still not at goal. -Increase metoprolol to 50 mg BID. -Return in 8 weeks for follow up.  Need for influenza vaccination -Flu vaccine administered today.    Patient Instructions  -Nice seeing you today!!  -Increase metoprolol to 50 mg twice daily.  -Flu vaccine today.  -Schedule follow up in 8 weeks.   Preventive Care 22-21 Years Old, Male Preventive care refers to lifestyle choices and visits with your health care provider that can promote health and wellness. This includes:  A yearly physical exam. This is also called an annual well check.  Regular dental and eye exams.  Immunizations.  Screening for certain conditions.  Healthy lifestyle choices, such as eating a healthy diet, getting regular exercise, not using drugs or products that contain nicotine and tobacco, and limiting alcohol use. What can I expect for my preventive care visit? Physical exam Your health care provider will check:  Height and weight. These may be used to calculate body mass index (BMI), which is a measurement that tells if you are at a healthy weight.  Heart rate and blood pressure.  Your skin for abnormal spots. Counseling Your health care provider may ask you questions about:  Alcohol, tobacco, and drug use.  Emotional well-being.  Home and relationship well-being.  Sexual activity.  Eating habits.  Work and work  Statistician. What immunizations do I need?  Influenza (flu) vaccine  This is recommended every year. Tetanus, diphtheria, and pertussis (Tdap) vaccine  You may need a Td booster every 10 years. Varicella (chickenpox) vaccine  You may need this vaccine if you have not already been vaccinated. Zoster (shingles) vaccine  You may need this after age 32. Measles, mumps, and rubella (MMR) vaccine  You may need at least one dose of MMR if you were born in 1957 or later. You may also need a second dose. Pneumococcal conjugate (PCV13) vaccine  You may need this if you have certain conditions and were not previously vaccinated. Pneumococcal polysaccharide (PPSV23) vaccine  You may need one or two doses if you smoke cigarettes or if you have certain conditions. Meningococcal conjugate (MenACWY) vaccine  You may need this if you have certain conditions. Hepatitis A vaccine  You may need this if you have certain conditions or if you travel or work in places where you may be exposed to hepatitis A. Hepatitis B vaccine  You may need this if you have certain conditions or if you travel or work in places where you may be exposed to hepatitis B. Haemophilus influenzae type b (Hib) vaccine  You may need this if you have certain risk factors. Human papillomavirus (HPV) vaccine  If recommended  by your health care provider, you may need three doses over 6 months. You may receive vaccines as individual doses or as more than one vaccine together in one shot (combination vaccines). Talk with your health care provider about the risks and benefits of combination vaccines. What tests do I need? Blood tests  Lipid and cholesterol levels. These may be checked every 5 years, or more frequently if you are over 57 years old.  Hepatitis C test.  Hepatitis B test. Screening  Lung cancer screening. You may have this screening every year starting at age 25 if you have a 30-pack-year history of smoking  and currently smoke or have quit within the past 15 years.  Prostate cancer screening. Recommendations will vary depending on your family history and other risks.  Colorectal cancer screening. All adults should have this screening starting at age 29 and continuing until age 64. Your health care provider may recommend screening at age 20 if you are at increased risk. You will have tests every 1-10 years, depending on your results and the type of screening test.  Diabetes screening. This is done by checking your blood sugar (glucose) after you have not eaten for a while (fasting). You may have this done every 1-3 years.  Sexually transmitted disease (STD) testing. Follow these instructions at home: Eating and drinking  Eat a diet that includes fresh fruits and vegetables, whole grains, lean protein, and low-fat dairy products.  Take vitamin and mineral supplements as recommended by your health care provider.  Do not drink alcohol if your health care provider tells you not to drink.  If you drink alcohol: ? Limit how much you have to 0-2 drinks a day. ? Be aware of how much alcohol is in your drink. In the [place] one drink equals one 12 oz bottle of beer (355 mL), one 5 oz glass of wine (148 mL), or one 1 oz glass of hard liquor (44 mL). Lifestyle  Take daily care of your teeth and gums.  Stay active. Exercise for at least 30 minutes on 5 or more days each week.  Do not use any products that contain nicotine or tobacco, such as cigarettes, e-cigarettes, and chewing tobacco. If you need help quitting, ask your health care provider.  If you are sexually active, practice safe sex. Use a condom or other form of protection to prevent STIs (sexually transmitted infections).  Talk with your health care provider about taking a low-dose aspirin every day starting at age 87. What's next?  Go to your health care provider once a year for a well check visit.  Ask your health care provider how  often you should have your eyes and teeth checked.  Stay up to date on all vaccines. This information is not intended to replace advice given to you by your health care provider. Make sure you discuss any questions you have with your health care provider. Document Revised: 03/05/2018 Document Reviewed: 03/05/2018 Elsevier Patient Education  2020 [place], [place] [facility]

## 2019-12-01 NOTE — Patient Instructions (Signed)
-Nice seeing you today!!  -Increase metoprolol to 50 mg twice daily.  -Flu vaccine today.  -Schedule follow up in 8 weeks.   Preventive Care 65-64 Years Old, Male Preventive care refers to lifestyle choices and visits with your health care provider that can promote health and wellness. This includes:  A yearly physical exam. This is also called an annual well check.  Regular dental and eye exams.  Immunizations.  Screening for certain conditions.  Healthy lifestyle choices, such as eating a healthy diet, getting regular exercise, not using drugs or products that contain nicotine and tobacco, and limiting alcohol use. What can I expect for my preventive care visit? Physical exam Your health care provider will check:  Height and weight. These may be used to calculate body mass index (BMI), which is a measurement that tells if you are at a healthy weight.  Heart rate and blood pressure.  Your skin for abnormal spots. Counseling Your health care provider may ask you questions about:  Alcohol, tobacco, and drug use.  Emotional well-being.  Home and relationship well-being.  Sexual activity.  Eating habits.  Work and work Statistician. What immunizations do I need?  Influenza (flu) vaccine  This is recommended every year. Tetanus, diphtheria, and pertussis (Tdap) vaccine  You may need a Td booster every 10 years. Varicella (chickenpox) vaccine  You may need this vaccine if you have not already been vaccinated. Zoster (shingles) vaccine  You may need this after age 53. Measles, mumps, and rubella (MMR) vaccine  You may need at least one dose of MMR if you were born in 1957 or later. You may also need a second dose. Pneumococcal conjugate (PCV13) vaccine  You may need this if you have certain conditions and were not previously vaccinated. Pneumococcal polysaccharide (PPSV23) vaccine  You may need one or two doses if you smoke cigarettes or if you have certain  conditions. Meningococcal conjugate (MenACWY) vaccine  You may need this if you have certain conditions. Hepatitis A vaccine  You may need this if you have certain conditions or if you travel or work in places where you may be exposed to hepatitis A. Hepatitis B vaccine  You may need this if you have certain conditions or if you travel or work in places where you may be exposed to hepatitis B. Haemophilus influenzae type b (Hib) vaccine  You may need this if you have certain risk factors. Human papillomavirus (HPV) vaccine  If recommended by your health care provider, you may need three doses over 6 months. You may receive vaccines as individual doses or as more than one vaccine together in one shot (combination vaccines). Talk with your health care provider about the risks and benefits of combination vaccines. What tests do I need? Blood tests  Lipid and cholesterol levels. These may be checked every 5 years, or more frequently if you are over 45 years old.  Hepatitis C test.  Hepatitis B test. Screening  Lung cancer screening. You may have this screening every year starting at age 72 if you have a 30-pack-year history of smoking and currently smoke or have quit within the past 15 years.  Prostate cancer screening. Recommendations will vary depending on your family history and other risks.  Colorectal cancer screening. All adults should have this screening starting at age 71 and continuing until age 78. Your health care provider may recommend screening at age 72 if you are at increased risk. You will have tests every 1-10 years, depending on  your results and the type of screening test.  Diabetes screening. This is done by checking your blood sugar (glucose) after you have not eaten for a while (fasting). You may have this done every 1-3 years.  Sexually transmitted disease (STD) testing. Follow these instructions at home: Eating and drinking  Eat a diet that includes fresh  fruits and vegetables, whole grains, lean protein, and low-fat dairy products.  Take vitamin and mineral supplements as recommended by your health care provider.  Do not drink alcohol if your health care provider tells you not to drink.  If you drink alcohol: ? Limit how much you have to 0-2 drinks a day. ? Be aware of how much alcohol is in your drink. In the [place] one drink equals one 12 oz bottle of beer (355 mL), one 5 oz glass of wine (148 mL), or one 1 oz glass of hard liquor (44 mL). Lifestyle  Take daily care of your teeth and gums.  Stay active. Exercise for at least 30 minutes on 5 or more days each week.  Do not use any products that contain nicotine or tobacco, such as cigarettes, e-cigarettes, and chewing tobacco. If you need help quitting, ask your health care provider.  If you are sexually active, practice safe sex. Use a condom or other form of protection to prevent STIs (sexually transmitted infections).  Talk with your health care provider about taking a low-dose aspirin every day starting at age 42. What's next?  Go to your health care provider once a year for a well check visit.  Ask your health care provider how often you should have your eyes and teeth checked.  Stay up to date on all vaccines. This information is not intended to replace advice given to you by your health care provider. Make sure you discuss any questions you have with your health care provider. Document Revised: 03/05/2018 Document Reviewed: 03/05/2018 Elsevier Patient Education  2020 [employer].

## 2019-12-01 NOTE — Addendum Note (Signed)
Addended by: Westley Hummer B on: 12/01/2019 08:25 AM   Modules accepted: Orders

## 2019-12-18 ENCOUNTER — Encounter: Payer: Self-pay | Admitting: Internal Medicine

## 2020-02-02 ENCOUNTER — Ambulatory Visit: Payer: PRIVATE HEALTH INSURANCE | Admitting: Internal Medicine

## 2020-02-02 ENCOUNTER — Encounter: Payer: Self-pay | Admitting: Internal Medicine

## 2020-02-02 ENCOUNTER — Other Ambulatory Visit: Payer: Self-pay

## 2020-02-02 VITALS — BP 140/88 | HR 78 | Temp 98.3°F | Wt 247.1 lb

## 2020-02-02 DIAGNOSIS — I10 Essential (primary) hypertension: Secondary | ICD-10-CM | POA: Diagnosis not present

## 2020-02-02 DIAGNOSIS — Z23 Encounter for immunization: Secondary | ICD-10-CM

## 2020-02-02 MED ORDER — METOPROLOL TARTRATE 75 MG PO TABS: 75.0000 mg | ORAL_TABLET | Freq: Two times a day (BID) | ORAL | 1 refills | Status: DC

## 2020-02-02 NOTE — Addendum Note (Signed)
Addended by: Westley Hummer B on: 02/02/2020 05:21 PM   Modules accepted: Orders

## 2020-02-02 NOTE — Patient Instructions (Addendum)
-  Nice seeing you today!!  -Increase metoprolol to 75 mg twice daily  -Shingles vaccine today.  -Schedule follow up in 6-8 weeks.

## 2020-02-02 NOTE — Progress Notes (Signed)
Established Patient Office Visit     This visit occurred during the SARS-CoV-2 public health emergency.  Safety protocols were in place, including screening questions prior to the visit, additional usage of staff PPE, and extensive cleaning of exam room while observing appropriate contact time as indicated for disinfecting solutions.    CC/Reason for Visit: Blood pressure follow-up, requesting second shingles vaccine  HPI: Jackson Alexander is a 64 y.o. male who is coming in today for the above mentioned reasons. Past Medical History is significant for: Obesity, hypertension and chronic venous insufficiency. He has been taken off amlodipine due to lower extremity edema. He is on max losartan/HCTZ dosing.  At last visit metoprolol was increased from 25 to 50 mg twice daily.  He is here today for follow-up.  He has been tolerating medication dose well, has no acute complaints, has noticed very minimal lower extremity edema especially at the end of his workday.  Blood pressures remain elevated in the 1 40-1 80 range.  He is requesting his second shingles vaccine today.   Past Medical/Surgical History: Past Medical History:  Diagnosis Date  . Chronic venous insufficiency   . Hx of adenomatous polyp of colon 11/14/2019   10/2019 diminutive adenoma recall 2028  . Hypertension   . Obesity (BMI 30-39.9)     Past Surgical History:  Procedure Laterality Date  . COLONOSCOPY  07/26/2008   Dr.Orr-hyperplastic  polyps    Social History:  reports that he has never smoked. He has never used smokeless tobacco. He reports current alcohol use of about 1.0 standard drink of alcohol per week. He reports that he does not use drugs.  Allergies: No Known Allergies  Family History:  Family History  Problem Relation Age of Onset  . Lung cancer Father   . Breast cancer Sister   . Colon polyps Sister   . Renal cancer Brother   . Colon polyps Brother   . Breast cancer Maternal Grandmother   .  Prostate cancer Maternal Grandfather   . Breast cancer Maternal Aunt   . Renal cancer Maternal Uncle   . Melanoma Brother   . Breast cancer Sister   . Breast cancer Sister   . Colon cancer Neg Hx   . Esophageal cancer Neg Hx   . Stomach cancer Neg Hx   . Rectal cancer Neg Hx      Current Outpatient Medications:  .  fexofenadine (ALLEGRA) 180 MG tablet, Take 180 mg by mouth daily., Disp: , Rfl:  .  losartan-hydrochlorothiazide (HYZAAR) 100-25 MG tablet, Take 1 tablet by mouth daily., Disp: 90 tablet, Rfl: 1 .  metoprolol tartrate 75 MG TABS, Take 75 mg by mouth 2 (two) times daily., Disp: 180 tablet, Rfl: 1 .  OVER THE COUNTER MEDICATION, Vitamin D3 50 mcg 2,000 iu per day, Disp: , Rfl:   Review of Systems:  Constitutional: Denies fever, chills, diaphoresis, appetite change and fatigue.  HEENT: Denies photophobia, eye pain, redness, hearing loss, ear pain, congestion, sore throat, rhinorrhea, sneezing, mouth sores, trouble swallowing, neck pain, neck stiffness and tinnitus.   Respiratory: Denies SOB, DOE, cough, chest tightness,  and wheezing.   Cardiovascular: Denies chest pain, palpitations. Gastrointestinal: Denies nausea, vomiting, abdominal pain, diarrhea, constipation, blood in stool and abdominal distention.  Genitourinary: Denies dysuria, urgency, frequency, hematuria, flank pain and difficulty urinating.  Endocrine: Denies: hot or cold intolerance, sweats, changes in hair or nails, polyuria, polydipsia. Musculoskeletal: Denies myalgias, back pain, joint swelling, arthralgias and gait problem.  Skin: Denies pallor, rash and wound.  Neurological: Denies dizziness, seizures, syncope, weakness, light-headedness, numbness and headaches.  Hematological: Denies adenopathy. Easy bruising, personal or family bleeding history  Psychiatric/Behavioral: Denies suicidal ideation, mood changes, confusion, nervousness, sleep disturbance and agitation    Physical Exam: Vitals:   02/02/20  0809  BP: 140/88  Pulse: 78  Temp: 98.3 F (36.8 C)  TempSrc: Oral  SpO2: 96%  Weight: 247 lb 1.6 oz (112.1 kg)    Body mass index is 36.49 kg/m.   Constitutional: NAD, calm, comfortable Eyes: PERRL, lids and conjunctivae normal, wears corrective lenses ENMT: Mucous membranes are moist. Respiratory: clear to auscultation bilaterally, no wheezing, no crackles. Normal respiratory effort. No accessory muscle use.  Cardiovascular: Regular rate and rhythm, no murmurs / rubs / gallops. No extremity edema.  Neurologic: Grossly intact and nonfocal Psychiatric: Normal judgment and insight. Alert and oriented x 3. Normal mood.    Impression and Plan:  Primary hypertension -Still not at goal, blood pressure in office today is 140/88. -Increase metoprolol to 75 mg twice daily, continue maximal doses of losartan and hydrochlorothiazide. -Return in 6 to 8 weeks for follow-up.  Need for shingles vaccine -Second shingles vaccine administered today without immediate complications.    Patient Instructions  -Nice seeing you today!!  -Increase metoprolol to 75 mg twice daily  -Shingles vaccine today.  -Schedule follow up in 6-8 weeks.     Lelon Frohlich, MD [facility]

## 2020-02-08 ENCOUNTER — Other Ambulatory Visit: Payer: Self-pay | Admitting: Internal Medicine

## 2020-02-08 DIAGNOSIS — I10 Essential (primary) hypertension: Secondary | ICD-10-CM

## 2020-03-29 ENCOUNTER — Encounter: Payer: Self-pay | Admitting: Internal Medicine

## 2020-03-29 ENCOUNTER — Ambulatory Visit: Payer: PRIVATE HEALTH INSURANCE | Admitting: Internal Medicine

## 2020-03-29 ENCOUNTER — Other Ambulatory Visit: Payer: Self-pay

## 2020-03-29 VITALS — BP 140/90 | HR 70 | Temp 98.0°F | Wt 247.1 lb

## 2020-03-29 DIAGNOSIS — I10 Essential (primary) hypertension: Secondary | ICD-10-CM | POA: Diagnosis not present

## 2020-03-29 DIAGNOSIS — E669 Obesity, unspecified: Secondary | ICD-10-CM | POA: Diagnosis not present

## 2020-03-29 MED ORDER — METOPROLOL TARTRATE 100 MG PO TABS: 100.0000 mg | ORAL_TABLET | Freq: Two times a day (BID) | ORAL | 1 refills | Status: DC

## 2020-03-29 NOTE — Progress Notes (Signed)
Established Patient Office Visit     This visit occurred during the SARS-CoV-2 public health emergency.  Safety protocols were in place, including screening questions prior to the visit, additional usage of staff PPE, and extensive cleaning of exam room while observing appropriate contact time as indicated for disinfecting solutions.    CC/Reason for Visit: Blood pressure follow-up  HPI: Jackson Alexander is a 65 y.o. male who is coming in today for the above mentioned reasons. Past Medical History is significant for: Hypertension and obesity.  He is on maximum doses of losartan, hydrochlorothiazide as well as 75 mg twice daily of metoprolol that was increased 8 weeks ago.  Unfortunately his blood pressure remains in the 140/90 range today in the office, his home measurements are consistent with this as well.  He has been doing his best to moderate sodium intake, he is otherwise feeling well and has no acute complaints.   Past Medical/Surgical History: Past Medical History:  Diagnosis Date  . Chronic venous insufficiency   . Hx of adenomatous polyp of colon 11/14/2019   10/2019 diminutive adenoma recall 2028  . Hypertension   . Obesity (BMI 30-39.9)     Past Surgical History:  Procedure Laterality Date  . COLONOSCOPY  07/26/2008   Dr.Orr-hyperplastic  polyps    Social History:  reports that he has never smoked. He has never used smokeless tobacco. He reports current alcohol use of about 1.0 standard drink of alcohol per week. He reports that he does not use drugs.  Allergies: No Known Allergies  Family History:  Family History  Problem Relation Age of Onset  . Lung cancer Father   . Breast cancer Sister   . Colon polyps Sister   . Renal cancer Brother   . Colon polyps Brother   . Breast cancer Maternal Grandmother   . Prostate cancer Maternal Grandfather   . Breast cancer Maternal Aunt   . Renal cancer Maternal Uncle   . Melanoma Brother   . Breast cancer Sister    . Breast cancer Sister   . Colon cancer Neg Hx   . Esophageal cancer Neg Hx   . Stomach cancer Neg Hx   . Rectal cancer Neg Hx      Current Outpatient Medications:  .  fexofenadine (ALLEGRA) 180 MG tablet, Take 180 mg by mouth daily., Disp: , Rfl:  .  losartan-hydrochlorothiazide (HYZAAR) 100-25 MG tablet, TAKE 1 TABLET BY MOUTH EVERY DAY, Disp: 90 tablet, Rfl: 1 .  metoprolol tartrate (LOPRESSOR) 100 MG tablet, Take 1 tablet (100 mg total) by mouth 2 (two) times daily., Disp: 180 tablet, Rfl: 1 .  OVER THE COUNTER MEDICATION, Vitamin D3 50 mcg 2,000 iu per day, Disp: , Rfl:   Review of Systems:  Constitutional: Denies fever, chills, diaphoresis, appetite change and fatigue.  HEENT: Denies photophobia, eye pain, redness, hearing loss, ear pain, congestion, sore throat, rhinorrhea, sneezing, mouth sores, trouble swallowing, neck pain, neck stiffness and tinnitus.   Respiratory: Denies SOB, DOE, cough, chest tightness,  and wheezing.   Cardiovascular: Denies chest pain, palpitations and leg swelling.  Gastrointestinal: Denies nausea, vomiting, abdominal pain, diarrhea, constipation, blood in stool and abdominal distention.  Genitourinary: Denies dysuria, urgency, frequency, hematuria, flank pain and difficulty urinating.  Endocrine: Denies: hot or cold intolerance, sweats, changes in hair or nails, polyuria, polydipsia. Musculoskeletal: Denies myalgias, back pain, joint swelling, arthralgias and gait problem.  Skin: Denies pallor, rash and wound.  Neurological: Denies dizziness, seizures, syncope, weakness,  light-headedness, numbness and headaches.  Hematological: Denies adenopathy. Easy bruising, personal or family bleeding history  Psychiatric/Behavioral: Denies suicidal ideation, mood changes, confusion, nervousness, sleep disturbance and agitation    Physical Exam: Vitals:   03/29/20 0806  BP: 140/90  Pulse: 70  Temp: 98 F (36.7 C)  TempSrc: Oral  SpO2: 96%  Weight: 247  lb 1.6 oz (112.1 kg)    Body mass index is 36.49 kg/m.   Constitutional: NAD, calm, comfortable Eyes: PERRL, lids and conjunctivae normal ENMT: Mucous membranes are moist.  Respiratory: clear to auscultation bilaterally, no wheezing, no crackles. Normal respiratory effort. No accessory muscle use.  Cardiovascular: Regular rate and rhythm, no murmurs / rubs / gallops. No extremity edema.  Neurologic: Grossly intact and nonfocal. Psychiatric: Normal judgment and insight. Alert and oriented x 3. Normal mood.    Impression and Plan:  Obesity (BMI 30-39.9) -Discussed healthy lifestyle, including increased physical activity and better food choices to promote weight loss.  Essential hypertension -Remains uncontrolled with a blood pressure 140/90. -Continue losartan 100 mg, hydrochlorothiazide 25 mg daily. -Increase metoprolol from 75 mg twice daily to 100 mg twice daily. -Return in 8 weeks for follow-up.    Patient Instructions  -Nice seeing you today!!  -Increase metoprolol to 100 mg twice daily.  -Schedule follow up in 8 weeks.       Lelon Frohlich, MD [facility]

## 2020-03-29 NOTE — Patient Instructions (Signed)
-  Nice seeing you today!!  -Increase metoprolol to 100 mg twice daily.  -Schedule follow up in 8 weeks.

## 2020-05-23 ENCOUNTER — Other Ambulatory Visit: Payer: Self-pay

## 2020-05-24 ENCOUNTER — Ambulatory Visit: Payer: PRIVATE HEALTH INSURANCE | Admitting: Internal Medicine

## 2020-05-24 ENCOUNTER — Encounter: Payer: Self-pay | Admitting: Internal Medicine

## 2020-05-24 VITALS — BP 185/100 | HR 61 | Temp 98.0°F | Ht 69.0 in | Wt 244.1 lb

## 2020-05-24 DIAGNOSIS — I10 Essential (primary) hypertension: Secondary | ICD-10-CM

## 2020-05-24 LAB — BASIC METABOLIC PANEL
BUN: 16 mg/dL (ref 6–23)
CO2: 34 mEq/L — ABNORMAL HIGH (ref 19–32)
Calcium: 9.5 mg/dL (ref 8.4–10.5)
Chloride: 97 mEq/L (ref 96–112)
Creatinine, Ser: 1.12 mg/dL (ref 0.40–1.50)
GFR: 69.28 mL/min (ref >60.00)
Glucose, Bld: 114 mg/dL — ABNORMAL HIGH (ref 70–99)
Potassium: 4 mEq/L (ref 3.5–5.1)
Sodium: 135 mEq/L (ref 135–145)

## 2020-05-24 MED ORDER — HYDRALAZINE HCL 50 MG PO TABS: 50.0000 mg | ORAL_TABLET | Freq: Three times a day (TID) | ORAL | 1 refills | Status: DC

## 2020-05-24 NOTE — Progress Notes (Signed)
Established Patient Office Visit     This visit occurred during the SARS-CoV-2 public health emergency.  Safety protocols were in place, including screening questions prior to the visit, additional usage of staff PPE, and extensive cleaning of exam room while observing appropriate contact time as indicated for disinfecting solutions.    CC/Reason for Visit: Blood pressure follow-up  HPI: Jackson Alexander is a 65 y.o. male who is coming in today for the above mentioned reasons.  We have been having issues controlling his blood pressure.  Currently he is on losartan 100 mg, hydrochlorothiazide 25 mg and metoprolol 100 mg twice daily.  3 separate measurements in office today have been 150/100, 200/100, 185/100.  He tells me this coincides with his home measurements.  He denies headache, chest pain, shortness of breath, dyspnea on exertion,  focal neurologic deficit.  He had been on amlodipine in the past and it needed to be discontinued due to significant peripheral edema.  He is not wanting to try it again.   Past Medical/Surgical History: Past Medical History:  Diagnosis Date  . Chronic venous insufficiency   . Hx of adenomatous polyp of colon 11/14/2019   10/2019 diminutive adenoma recall 2028  . Hypertension   . Obesity (BMI 30-39.9)     Past Surgical History:  Procedure Laterality Date  . COLONOSCOPY  07/26/2008   Dr.Orr-hyperplastic  polyps    Social History:  reports that he has never smoked. He has never used smokeless tobacco. He reports current alcohol use of about 1.0 standard drink of alcohol per week. He reports that he does not use drugs.  Allergies: No Known Allergies  Family History:  Family History  Problem Relation Age of Onset  . Lung cancer Father   . Breast cancer Sister   . Colon polyps Sister   . Renal cancer Brother   . Colon polyps Brother   . Breast cancer Maternal Grandmother   . Prostate cancer Maternal Grandfather   . Breast cancer  Maternal Aunt   . Renal cancer Maternal Uncle   . Melanoma Brother   . Breast cancer Sister   . Breast cancer Sister   . Colon cancer Neg Hx   . Esophageal cancer Neg Hx   . Stomach cancer Neg Hx   . Rectal cancer Neg Hx      Current Outpatient Medications:  .  fexofenadine (ALLEGRA) 180 MG tablet, Take 180 mg by mouth daily., Disp: , Rfl:  .  hydrALAZINE (APRESOLINE) 50 MG tablet, Take 1 tablet (50 mg total) by mouth 3 (three) times daily., Disp: 270 tablet, Rfl: 1 .  losartan-hydrochlorothiazide (HYZAAR) 100-25 MG tablet, TAKE 1 TABLET BY MOUTH EVERY DAY, Disp: 90 tablet, Rfl: 1 .  metoprolol tartrate (LOPRESSOR) 100 MG tablet, Take 1 tablet (100 mg total) by mouth 2 (two) times daily., Disp: 180 tablet, Rfl: 1 .  OVER THE COUNTER MEDICATION, Vitamin D3 50 mcg 2,000 iu per day, Disp: , Rfl:   Review of Systems:  Constitutional: Denies fever, chills, diaphoresis, appetite change and fatigue.  HEENT: Denies photophobia, eye pain, redness, hearing loss, ear pain, congestion, sore throat, rhinorrhea, sneezing, mouth sores, trouble swallowing, neck pain, neck stiffness and tinnitus.   Respiratory: Denies SOB, DOE, cough, chest tightness,  and wheezing.   Cardiovascular: Denies chest pain, palpitations and leg swelling.  Gastrointestinal: Denies nausea, vomiting, abdominal pain, diarrhea, constipation, blood in stool and abdominal distention.  Genitourinary: Denies dysuria, urgency, frequency, hematuria, flank pain and difficulty  urinating.  Endocrine: Denies: hot or cold intolerance, sweats, changes in hair or nails, polyuria, polydipsia. Musculoskeletal: Denies myalgias, back pain, joint swelling, arthralgias and gait problem.  Skin: Denies pallor, rash and wound.  Neurological: Denies dizziness, seizures, syncope, weakness, light-headedness, numbness and headaches.  Hematological: Denies adenopathy. Easy bruising, personal or family bleeding history  Psychiatric/Behavioral: Denies  suicidal ideation, mood changes, confusion, nervousness, sleep disturbance and agitation    Physical Exam: Vitals:   05/24/20 0829 05/24/20 0856 05/24/20 0857  BP: (!) 150/100 (!) 200/100 (!) 185/100  Pulse: 61    Temp: 98 F (36.7 C)    TempSrc: Oral    SpO2: 96%    Weight: 244 lb 1.6 oz (110.7 kg)    Height: 5\' 9"  (1.753 m)      Body mass index is 36.05 kg/m.   Constitutional: NAD, calm, comfortable Eyes: PERRL, lids and conjunctivae normal ENMT: Mucous membranes are moist.  Respiratory: clear to auscultation bilaterally, no wheezing, no crackles. Normal respiratory effort. No accessory muscle use.  Cardiovascular: Regular rate and rhythm, no murmurs / rubs / gallops. No extremity edema.  Neurologic: Grossly intact and nonfocal. Psychiatric: Normal judgment and insight. Alert and oriented x 3. Normal mood.    Impression and Plan:  Primary hypertension -Blood pressure remains uncontrolled. -Add hydralazine 50 mg 3 times a day. -Continue losartan 100 mg daily, hydrochlorothiazide 25 mg daily and metoprolol 100 mg twice daily. -We have discussed low-sodium diet, he will return in 6 weeks for follow-up. -Basic metabolic panel today to follow renal function and electrolytes   Patient Instructions  -Nice seeing you today!!  -Lab work today; will notify you once results are available.  -Start hydralazine 50 mg 3 times a day.  -Continue other medications as you are taking without change.  -Schedule follow up in 6 weeks.  Bring home BP measurements to that visit.     Lelon Frohlich, MD [facility] at [facility]

## 2020-05-24 NOTE — Addendum Note (Signed)
Addended by: Tessie Fass D on: 05/24/2020 09:04 AM   Modules accepted: Orders

## 2020-05-24 NOTE — Patient Instructions (Signed)
-  Nice seeing you today!!  -Lab work today; will notify you once results are available.  -Start hydralazine 50 mg 3 times a day.  -Continue other medications as you are taking without change.  -Schedule follow up in 6 weeks.  Bring home BP measurements to that visit.

## 2020-05-26 ENCOUNTER — Ambulatory Visit: Payer: PRIVATE HEALTH INSURANCE | Admitting: *Deleted

## 2020-05-26 ENCOUNTER — Other Ambulatory Visit: Payer: Self-pay

## 2020-05-26 DIAGNOSIS — I10 Essential (primary) hypertension: Secondary | ICD-10-CM

## 2020-05-26 NOTE — Progress Notes (Signed)
EE requests BP check and updating clinic information with new medications.  Losartan/HCTZ 100/25 mg daily, Metoprolol 100 mgs twice daily, Hydrolazine 50 mgs three times daily.  Automatic cuff: 168/108. Reports 178/100 reading at home this morning. States MD office readings on 05/24/2020 were 200/100, then 185/100. Manual reading large cuff: 170/98. Pulse: 65. Discussed sodium, stress, sleep, possibility of OSA discussion with provider. Follow-up with provider and in clinic for further needs and assistance with BP control. Pt verbalizes understanding.

## 2020-07-05 ENCOUNTER — Encounter: Payer: Self-pay | Admitting: Internal Medicine

## 2020-07-05 ENCOUNTER — Ambulatory Visit: Payer: PRIVATE HEALTH INSURANCE | Admitting: Internal Medicine

## 2020-07-05 ENCOUNTER — Other Ambulatory Visit: Payer: Self-pay

## 2020-07-05 VITALS — BP 140/90 | HR 68 | Temp 97.8°F | Wt 243.9 lb

## 2020-07-05 DIAGNOSIS — E669 Obesity, unspecified: Secondary | ICD-10-CM | POA: Diagnosis not present

## 2020-07-05 DIAGNOSIS — I10 Essential (primary) hypertension: Secondary | ICD-10-CM

## 2020-07-05 MED ORDER — HYDRALAZINE HCL 50 MG PO TABS: 75.0000 mg | ORAL_TABLET | Freq: Three times a day (TID) | ORAL | 1 refills | Status: DC

## 2020-07-05 NOTE — Progress Notes (Signed)
- Established Patient Office Visit     This visit occurred during the SARS-CoV-2 public health emergency.  Safety protocols were in place, including screening questions prior to the visit, additional usage of staff PPE, and extensive cleaning of exam room while observing appropriate contact time as indicated for disinfecting solutions.    CC/Reason for Visit: Blood pressure follow-up  HPI: Jackson Alexander is a 65 y.o. male who is coming in today for the above mentioned reasons. Past Medical History is significant for: Obesity, difficult to control hypertension and venous insufficiency.  He is currently on losartan 100 mg, hydrochlorothiazide 25 mg, metoprolol 100 mg twice daily and hydralazine 50 mg 3 times a day.  He brings in his blood pressure measurements.  His systolics remain in the 1 40-1 70 range with diastolics in the high 51O to low 100 range.  He has worked hard on low sodium intake.   Past Medical/Surgical History: Past Medical History:  Diagnosis Date  . Chronic venous insufficiency   . Hx of adenomatous polyp of colon 11/14/2019   10/2019 diminutive adenoma recall 2028  . Hypertension   . Obesity (BMI 30-39.9)     Past Surgical History:  Procedure Laterality Date  . COLONOSCOPY  07/26/2008   Dr.Orr-hyperplastic  polyps    Social History:  reports that he has never smoked. He has never used smokeless tobacco. He reports current alcohol use of about 1.0 standard drink of alcohol per week. He reports that he does not use drugs.  Allergies: No Known Allergies  Family History:  Family History  Problem Relation Age of Onset  . Lung cancer Father   . Breast cancer Sister   . Colon polyps Sister   . Renal cancer Brother   . Colon polyps Brother   . Breast cancer Maternal Grandmother   . Prostate cancer Maternal Grandfather   . Breast cancer Maternal Aunt   . Renal cancer Maternal Uncle   . Melanoma Brother   . Breast cancer Sister   . Breast cancer  Sister   . Colon cancer Neg Hx   . Esophageal cancer Neg Hx   . Stomach cancer Neg Hx   . Rectal cancer Neg Hx      Current Outpatient Medications:  .  fexofenadine (ALLEGRA) 180 MG tablet, Take 180 mg by mouth daily., Disp: , Rfl:  .  losartan-hydrochlorothiazide (HYZAAR) 100-25 MG tablet, TAKE 1 TABLET BY MOUTH EVERY DAY, Disp: 90 tablet, Rfl: 1 .  metoprolol tartrate (LOPRESSOR) 100 MG tablet, Take 1 tablet (100 mg total) by mouth 2 (two) times daily., Disp: 180 tablet, Rfl: 1 .  OVER THE COUNTER MEDICATION, Vitamin D3 50 mcg 2,000 iu per day, Disp: , Rfl:  .  hydrALAZINE (APRESOLINE) 50 MG tablet, Take 1.5 tablets (75 mg total) by mouth 3 (three) times daily., Disp: 270 tablet, Rfl: 1  Review of Systems:  Constitutional: Denies fever, chills, diaphoresis, appetite change and fatigue.  HEENT: Denies photophobia, eye pain, redness, hearing loss, ear pain, congestion, sore throat, rhinorrhea, sneezing, mouth sores, trouble swallowing, neck pain, neck stiffness and tinnitus.   Respiratory: Denies SOB, DOE, cough, chest tightness,  and wheezing.   Cardiovascular: Denies chest pain, palpitations and leg swelling.  Gastrointestinal: Denies nausea, vomiting, abdominal pain, diarrhea, constipation, blood in stool and abdominal distention.  Genitourinary: Denies dysuria, urgency, frequency, hematuria, flank pain and difficulty urinating.  Endocrine: Denies: hot or cold intolerance, sweats, changes in hair or nails, polyuria, polydipsia. Musculoskeletal: Denies myalgias,  back pain, joint swelling, arthralgias and gait problem.  Skin: Denies pallor, rash and wound.  Neurological: Denies dizziness, seizures, syncope, weakness, light-headedness, numbness and headaches.  Hematological: Denies adenopathy. Easy bruising, personal or family bleeding history  Psychiatric/Behavioral: Denies suicidal ideation, mood changes, confusion, nervousness, sleep disturbance and agitation    Physical  Exam: Vitals:   07/05/20 0856  BP: 140/90  Pulse: 68  Temp: 97.8 F (36.6 C)  TempSrc: Oral  SpO2: 97%  Weight: 243 lb 14.4 oz (110.6 kg)    Body mass index is 36.02 kg/m.   Constitutional: NAD, calm, comfortable Eyes: PERRL, lids and conjunctivae normal, wears corrective lenses ENMT: Mucous membranes are moist.  Respiratory: clear to auscultation bilaterally, no wheezing, no crackles. Normal respiratory effort. No accessory muscle use.  Cardiovascular: Regular rate and rhythm, no murmurs / rubs / gallops.  Trace bilateral lower extremity edema.  Neurologic: Grossly intact and nonfocal Psychiatric: Normal judgment and insight. Alert and oriented x 3. Normal mood.    Impression and Plan:  Primary hypertension  -Unfortunately still not controlled with an in office blood pressure of 140/90 today, although definitely improved from prior. -Continue losartan 100 mg daily, hydrochlorothiazide 25 mg daily, metoprolol 100 mg twice daily, increase hydralazine from 50 to 75 mg 3 times a day. -He will continue to do ambulatory blood pressure measurements and return in 6 to 8 weeks for follow-up.  Obesity (BMI 30-39.9) -Discussed healthy lifestyle, including increased physical activity and better food choices to promote weight loss.     Lelon Frohlich, MD [facility] at [facility]

## 2020-08-05 ENCOUNTER — Other Ambulatory Visit: Payer: Self-pay | Admitting: Internal Medicine

## 2020-08-05 DIAGNOSIS — I10 Essential (primary) hypertension: Secondary | ICD-10-CM

## 2020-08-14 ENCOUNTER — Other Ambulatory Visit: Payer: Self-pay

## 2020-08-14 ENCOUNTER — Ambulatory Visit: Payer: Self-pay | Admitting: *Deleted

## 2020-08-14 VITALS — BP 154/97 | HR 69 | Ht 69.0 in | Wt 241.0 lb

## 2020-08-14 DIAGNOSIS — Z0000 Encounter for general adult medical examination without abnormal findings: Secondary | ICD-10-CM

## 2020-08-14 DIAGNOSIS — E559 Vitamin D deficiency, unspecified: Secondary | ICD-10-CM

## 2020-08-14 NOTE — Progress Notes (Signed)
[employer] insurance premium discount evaluation: Labs Drawn. Replacements ROI form signed. Tobacco Free Attestation form signed.  Forms placed in paper chart.  Has taken BP meds as Rx'd. Going back to pcp 08/30/20 for next BP f/u to adjust meds.

## 2020-08-15 ENCOUNTER — Encounter: Payer: Self-pay | Admitting: Registered Nurse

## 2020-08-15 LAB — CMP12+LP+TP+TSH+6AC+PSA+CBC…
ALT: 25 IU/L (ref 0–44)
AST: 20 IU/L (ref 0–40)
Albumin/Globulin Ratio: 2.8 — ABNORMAL HIGH (ref 1.2–2.2)
Albumin: 4.7 g/dL (ref 3.8–4.8)
Alkaline Phosphatase: 57 IU/L (ref 44–121)
BUN/Creatinine Ratio: 14 (ref 10–24)
BUN: 13 mg/dL (ref 8–27)
Basophils Absolute: 0 10*3/uL (ref 0.0–0.2)
Basos: 0 %
Bilirubin Total: 0.3 mg/dL (ref 0.0–1.2)
Calcium: 9.3 mg/dL (ref 8.6–10.2)
Chloride: 99 mmol/L (ref 96–106)
Chol/HDL Ratio: 3.5 ratio (ref 0.0–5.0)
Cholesterol, Total: 149 mg/dL (ref 100–199)
Creatinine, Ser: 0.94 mg/dL (ref 0.76–1.27)
EOS (ABSOLUTE): 0.1 10*3/uL (ref 0.0–0.4)
Eos: 2 %
Estimated CHD Risk: 0.6 times avg. (ref 0.0–1.0)
Free Thyroxine Index: 2 (ref 1.2–4.9)
GGT: 37 IU/L (ref 0–65)
Globulin, Total: 1.7 g/dL (ref 1.5–4.5)
Glucose: 114 mg/dL — ABNORMAL HIGH (ref 65–99)
HDL: 42 mg/dL (ref >39)
Hematocrit: 47 % (ref 37.5–51.0)
Hemoglobin: 16.3 g/dL (ref 13.0–17.7)
Immature Grans (Abs): 0 10*3/uL (ref 0.0–0.1)
Immature Granulocytes: 0 %
Iron: 69 ug/dL (ref 38–169)
LDH: 190 IU/L (ref 121–224)
LDL Chol Calc (NIH): 89 mg/dL (ref 0–99)
Lymphocytes Absolute: 2.3 10*3/uL (ref 0.7–3.1)
Lymphs: 36 %
MCH: 29.1 pg (ref 26.6–33.0)
MCHC: 34.7 g/dL (ref 31.5–35.7)
MCV: 84 fL (ref 79–97)
Monocytes Absolute: 0.4 10*3/uL (ref 0.1–0.9)
Monocytes: 6 %
Neutrophils Absolute: 3.6 10*3/uL (ref 1.4–7.0)
Neutrophils: 56 %
Phosphorus: 3 mg/dL (ref 2.8–4.1)
Platelets: 178 10*3/uL (ref 150–450)
Potassium: 3.7 mmol/L (ref 3.5–5.2)
Prostate Specific Ag, Serum: 2 ng/mL (ref 0.0–4.0)
RBC: 5.6 x10E6/uL (ref 4.14–5.80)
RDW: 13.3 % (ref 11.6–15.4)
Sodium: 137 mmol/L (ref 134–144)
T3 Uptake Ratio: 26 % (ref 24–39)
T4, Total: 7.7 ug/dL (ref 4.5–12.0)
TSH: 1.64 u[IU]/mL (ref 0.450–4.500)
Total Protein: 6.4 g/dL (ref 6.0–8.5)
Triglycerides: 97 mg/dL (ref 0–149)
Uric Acid: 3.1 mg/dL — ABNORMAL LOW (ref 3.8–8.4)
VLDL Cholesterol Cal: 18 mg/dL (ref 5–40)
WBC: 6.5 10*3/uL (ref 3.4–10.8)
eGFR: 90 mL/min/{1.73_m2} (ref >59)

## 2020-08-15 LAB — HGB A1C W/O EAG: Hgb A1c MFr Bld: 5.5 % (ref 4.8–5.6)

## 2020-08-15 LAB — VITAMIN D 25 HYDROXY (VIT D DEFICIENCY, FRACTURES): Vit D, 25-Hydroxy: 48.5 ng/mL (ref 30.0–100.0)

## 2020-08-30 ENCOUNTER — Other Ambulatory Visit: Payer: Self-pay

## 2020-08-30 ENCOUNTER — Ambulatory Visit: Payer: PRIVATE HEALTH INSURANCE | Admitting: Internal Medicine

## 2020-08-30 ENCOUNTER — Encounter: Payer: Self-pay | Admitting: Internal Medicine

## 2020-08-30 VITALS — BP 130/80 | HR 61 | Temp 97.9°F | Ht 69.0 in | Wt 242.4 lb

## 2020-08-30 DIAGNOSIS — I10 Essential (primary) hypertension: Secondary | ICD-10-CM | POA: Diagnosis not present

## 2020-08-30 NOTE — Progress Notes (Signed)
Established Patient Office Visit     This visit occurred during the SARS-CoV-2 public health emergency.  Safety protocols were in place, including screening questions prior to the visit, additional usage of staff PPE, and extensive cleaning of exam room while observing appropriate contact time as indicated for disinfecting solutions.    CC/Reason for Visit: Blood pressure follow-up  HPI: Jackson Alexander is a 65 y.o. male who is coming in today for the above mentioned reasons. Past Medical History is significant for: Hypertension that has been difficult to control.  He is on multidrug regimen including losartan 100 mg, hydrochlorothiazide 25 mg, metoprolol 100 mg twice daily, at last visit on April 13 his hydralazine was increased to 75 mg 3 times a day.  He is here today for follow-up.  He does not bring in his blood pressure cuff or his ambulatory measurements.  He is feeling well and has no acute complaints.   Past Medical/Surgical History: Past Medical History:  Diagnosis Date  . Chronic venous insufficiency   . Hx of adenomatous polyp of colon 11/14/2019   10/2019 diminutive adenoma recall 2028  . Hypertension   . Obesity (BMI 30-39.9)     Past Surgical History:  Procedure Laterality Date  . COLONOSCOPY  07/26/2008   Dr.Orr-hyperplastic  polyps    Social History:  reports that he has never smoked. He has never used smokeless tobacco. He reports current alcohol use of about 1.0 standard drink of alcohol per week. He reports that he does not use drugs.  Allergies: No Known Allergies  Family History:  Family History  Problem Relation Age of Onset  . Lung cancer Father   . Breast cancer Sister   . Colon polyps Sister   . Renal cancer Brother   . Colon polyps Brother   . Breast cancer Maternal Grandmother   . Prostate cancer Maternal Grandfather   . Breast cancer Maternal Aunt   . Renal cancer Maternal Uncle   . Melanoma Brother   . Breast cancer Sister   .  Breast cancer Sister   . Colon cancer Neg Hx   . Esophageal cancer Neg Hx   . Stomach cancer Neg Hx   . Rectal cancer Neg Hx      Current Outpatient Medications:  .  fexofenadine (ALLEGRA) 180 MG tablet, Take 180 mg by mouth daily., Disp: , Rfl:  .  hydrALAZINE (APRESOLINE) 50 MG tablet, Take 1.5 tablets (75 mg total) by mouth 3 (three) times daily., Disp: 270 tablet, Rfl: 1 .  losartan-hydrochlorothiazide (HYZAAR) 100-25 MG tablet, TAKE 1 TABLET BY MOUTH EVERY DAY, Disp: 90 tablet, Rfl: 0 .  metoprolol tartrate (LOPRESSOR) 100 MG tablet, Take 1 tablet (100 mg total) by mouth 2 (two) times daily., Disp: 180 tablet, Rfl: 1 .  OVER THE COUNTER MEDICATION, Vitamin D3 50 mcg 2,000 iu per day, Disp: , Rfl:   Review of Systems:  Constitutional: Denies fever, chills, diaphoresis, appetite change and fatigue.  HEENT: Denies photophobia, eye pain, redness, hearing loss, ear pain, congestion, sore throat, rhinorrhea, sneezing, mouth sores, trouble swallowing, neck pain, neck stiffness and tinnitus.   Respiratory: Denies SOB, DOE, cough, chest tightness,  and wheezing.   Cardiovascular: Denies chest pain, palpitations and leg swelling.  Gastrointestinal: Denies nausea, vomiting, abdominal pain, diarrhea, constipation, blood in stool and abdominal distention.  Genitourinary: Denies dysuria, urgency, frequency, hematuria, flank pain and difficulty urinating.  Endocrine: Denies: hot or cold intolerance, sweats, changes in hair or nails, polyuria,  polydipsia. Musculoskeletal: Denies myalgias, back pain, joint swelling, arthralgias and gait problem.  Skin: Denies pallor, rash and wound.  Neurological: Denies dizziness, seizures, syncope, weakness, light-headedness, numbness and headaches.  Hematological: Denies adenopathy. Easy bruising, personal or family bleeding history  Psychiatric/Behavioral: Denies suicidal ideation, mood changes, confusion, nervousness, sleep disturbance and  agitation    Physical Exam: Vitals:   08/30/20 0856  BP: 130/80  Pulse: 61  Temp: 97.9 F (36.6 C)  TempSrc: Oral  SpO2: 97%  Weight: 242 lb 6.4 oz (110 kg)  Height: 5\' 9"  (1.753 m)    Body mass index is 35.8 kg/m.   Constitutional: NAD, calm, comfortable Eyes: PERRL, lids and conjunctivae normal, wears corrective lenses ENMT: Mucous membranes are moist.  Respiratory: clear to auscultation bilaterally, no wheezing, no crackles. Normal respiratory effort. No accessory muscle use.  Cardiovascular: Regular rate and rhythm, no murmurs / rubs / gallops. No extremity edema.  Neurologic: Grossly intact and nonfocal Psychiatric: Normal judgment and insight. Alert and oriented x 3. Normal mood.    Impression and Plan:  Primary hypertension  -Fairly well controlled with an in office blood pressure of 130/80. -Continue multidrug regimen as above. -Follow-up in 4 months.    Lelon Frohlich, MD [facility] at [facility]

## 2020-09-21 ENCOUNTER — Other Ambulatory Visit: Payer: Self-pay | Admitting: Internal Medicine

## 2020-09-21 DIAGNOSIS — I10 Essential (primary) hypertension: Secondary | ICD-10-CM

## 2020-10-24 ENCOUNTER — Other Ambulatory Visit: Payer: Self-pay

## 2020-10-24 ENCOUNTER — Ambulatory Visit: Payer: Self-pay | Admitting: Registered Nurse

## 2020-10-24 ENCOUNTER — Encounter: Payer: Self-pay | Admitting: Registered Nurse

## 2020-10-24 VITALS — BP 140/88 | HR 60 | Temp 97.9°F

## 2020-10-24 DIAGNOSIS — H8111 Benign paroxysmal vertigo, right ear: Secondary | ICD-10-CM

## 2020-10-24 DIAGNOSIS — H6983 Other specified disorders of Eustachian tube, bilateral: Secondary | ICD-10-CM

## 2020-10-24 MED ORDER — MECLIZINE HCL 25 MG PO TABS: 25.0000 mg | ORAL_TABLET | Freq: Four times a day (QID) | ORAL | 0 refills | Status: AC | PRN

## 2020-10-24 MED ORDER — SALINE SPRAY 0.65 % NA SOLN: 2.0000 | NASAL | 0 refills | Status: AC

## 2020-10-24 NOTE — Progress Notes (Signed)
Subjective:    Patient ID: Jackson Alexander, male    DOB: Aug 01, 1955, 65 y.o.   MRN: [id number]  65y/o established caucasian male pt c/o lightheadedness. States he took a shower this morning and was drying off when he had a sudden onset of a "weird, fuzzy feeling." Felt like he was falling forward, almost like he was pushed in the back, but was able to catch himself. No fall, no LOC, no injury. States that resolved quickly but he has felt lightheaded intermittently all day.   Notes that in the morning for some time now he has had to first sit at side of bed for a couple of minutes before standing and sometimes holds onto his tall dresser when standing.   Some sinus pressure and fewer stools than normal the previous week.  Urinating normally along with po intake.  Typically has protein shake and meal replacement shake/protein bar lunch then chicken/vegetables dinner.  Last night had soup.  Denied history of summer colds/allergies, visual changes, fever, chills, n/v/d, sick contacts, cough, rhinitis, dyspnea, ear pain, shortness of breath, rash or body aches.     Review of Systems  Constitutional:  Negative for activity change, appetite change, chills, diaphoresis, fatigue, fever and unexpected weight change.  HENT:  Positive for sinus pressure. Negative for congestion, ear discharge, ear pain, mouth sores, nosebleeds, postnasal drip, rhinorrhea, sinus pain, sneezing, sore throat, trouble swallowing and voice change.   Eyes:  Negative for photophobia and visual disturbance.  Respiratory:  Negative for cough, choking, shortness of breath, wheezing and stridor.   Cardiovascular:  Negative for palpitations and leg swelling.  Gastrointestinal:  Positive for constipation. Negative for abdominal pain, diarrhea, nausea and vomiting.  Endocrine: Negative for cold intolerance and heat intolerance.  Genitourinary:  Negative for difficulty urinating.  Musculoskeletal:  Negative for back pain, gait problem,  myalgias, neck pain and neck stiffness.  Skin:  Negative for rash.  Allergic/Immunologic: Positive for environmental allergies. Negative for food allergies.  Neurological:  Positive for dizziness and light-headedness. Negative for tremors, seizures, syncope, facial asymmetry, speech difficulty, numbness and headaches.  Hematological:  Negative for adenopathy. Does not bruise/bleed easily.  Psychiatric/Behavioral:  Negative for agitation, confusion and sleep disturbance.       Objective:   Physical Exam Vitals and nursing note reviewed.  Constitutional:      General: He is awake. He is not in acute distress.    Appearance: Normal appearance. He is well-developed and well-groomed. He is obese. He is ill-appearing. He is not toxic-appearing or diaphoretic.  HENT:     Head: Normocephalic and atraumatic.     Jaw: There is normal jaw occlusion. No trismus or swelling.     Salivary Glands: Right salivary gland is not diffusely enlarged or tender. Left salivary gland is not diffusely enlarged or tender.     Right Ear: Hearing, ear canal and external ear normal. A middle ear effusion is present. There is no impacted cerumen. Tympanic membrane is erythematous.     Left Ear: Hearing, ear canal and external ear normal. A middle ear effusion is present. There is no impacted cerumen.     Ears:      Nose: Mucosal edema and rhinorrhea present. No nasal deformity, septal deviation or laceration. Rhinorrhea is clear.     Right Turbinates: Enlarged. Not pale.     Left Turbinates: Enlarged. Not pale.     Right Sinus: Maxillary sinus tenderness present. No frontal sinus tenderness.     Left Sinus:  Maxillary sinus tenderness present. No frontal sinus tenderness.     Comments: Maxillary sinuses worsening pressure with palpations bilaterally; lower eyelids 1-2+/4 nonpitting edema and allergic shiners; cobblestoning posterior pharynx; clear discharge bilateral nasal turbinates edema erythema; right TM with patch  erythema 11-2 o'clock centrally; air fluid level clear bilaterally TMs intact; no debris in auditory canals; patient denies cotton applicator use in ears    Mouth/Throat:     Lips: Pink. No lesions.     Mouth: Mucous membranes are moist. Mucous membranes are not pale, not dry and not cyanotic. No lacerations or oral lesions.     Dentition: No dental caries, dental abscesses or gum lesions.     Tongue: No lesions. Tongue does not deviate from midline.     Palate: No mass and lesions.     Pharynx: Uvula midline. Pharyngeal swelling and posterior oropharyngeal erythema present. No oropharyngeal exudate or uvula swelling.     Tonsils: No tonsillar exudate or tonsillar abscesses. 0 on the right. 0 on the left.  Eyes:     General: Lids are normal. Vision grossly intact. Gaze aligned appropriately. Allergic shiner present. No visual field deficit or scleral icterus.       Right eye: No foreign body, discharge or hordeolum.        Left eye: No foreign body, discharge or hordeolum.     Extraocular Movements: Extraocular movements intact.     Right eye: Normal extraocular motion and no nystagmus.     Left eye: Normal extraocular motion and no nystagmus.     Conjunctiva/sclera: Conjunctivae normal.     Right eye: Right conjunctiva is not injected. No chemosis, exudate or hemorrhage.    Left eye: Left conjunctiva is not injected. No chemosis, exudate or hemorrhage.    Pupils: Pupils are equal, round, and reactive to light. Pupils are equal.     Right eye: Pupil is round and reactive.     Left eye: Pupil is round and reactive.  Neck:     Thyroid: No thyroid mass or thyromegaly.     Trachea: Trachea and phonation normal. No tracheal tenderness or tracheal deviation.  Cardiovascular:     Rate and Rhythm: Normal rate and regular rhythm.     Chest Wall: PMI is not displaced.     Pulses:          Radial pulses are 2+ on the right side and 2+ on the left side.     Heart sounds: Normal heart sounds, S1  normal and S2 normal. No murmur heard.   No friction rub. No gallop.  Pulmonary:     Effort: Pulmonary effort is normal. No respiratory distress.     Breath sounds: Normal breath sounds and air entry. No stridor or transmitted upper airway sounds. No decreased breath sounds, wheezing, rhonchi or rales.     Comments: Wearing mask in exam room; spoke full sentences without difficulty; no cough or throat clearing noted/observed Abdominal:     General: Abdomen is flat. Bowel sounds are decreased. There is no distension.     Palpations: Abdomen is soft. There is no shifting dullness, fluid wave, hepatomegaly, splenomegaly, mass or pulsatile mass.     Tenderness: There is no abdominal tenderness. There is no right CVA tenderness, left CVA tenderness, guarding or rebound. Negative signs include Murphy's sign.     Hernia: No hernia is present.     Comments: Dull to percussion x 4 quads; hypoactive bowel sounds  Musculoskeletal:  General: No swelling, tenderness or deformity. Normal range of motion.     Right shoulder: No swelling, deformity, effusion, laceration or crepitus. Normal strength.     Left shoulder: No swelling, deformity, effusion, laceration or crepitus. Normal strength.     Right elbow: No swelling, deformity, effusion or lacerations. Normal range of motion.     Left elbow: No swelling, deformity, effusion or lacerations. Normal range of motion.     Right hand: No swelling, deformity or lacerations. Normal range of motion. Normal strength.     Left hand: No swelling, deformity or lacerations. Normal range of motion. Normal strength.     Cervical back: Normal range of motion and neck supple. No swelling, edema, deformity, erythema, signs of trauma, lacerations, rigidity, torticollis, tenderness or crepitus. No pain with movement, spinous process tenderness or muscular tenderness. Normal range of motion.     Thoracic back: No swelling, edema, deformity, signs of trauma or lacerations.  Normal range of motion.     Lumbar back: No swelling, edema, deformity, signs of trauma or lacerations.     Right lower leg: No swelling or tenderness. No edema.     Left lower leg: No swelling or tenderness. No edema.  Lymphadenopathy:     Head:     Right side of head: No submental, submandibular, tonsillar, preauricular, posterior auricular or occipital adenopathy.     Left side of head: No submental, submandibular, tonsillar, preauricular, posterior auricular or occipital adenopathy.     Cervical: No cervical adenopathy.     Right cervical: No superficial, deep or posterior cervical adenopathy.    Left cervical: No superficial, deep or posterior cervical adenopathy.  Skin:    General: Skin is warm and dry.     Capillary Refill: Capillary refill takes less than 2 seconds.     Coloration: Skin is not ashen, cyanotic, jaundiced, mottled, pale or sallow.     Findings: No abrasion, abscess, acne, bruising, burn, ecchymosis, erythema, signs of injury, laceration, lesion, petechiae or rash.     Nails: There is no clubbing.  Neurological:     General: No focal deficit present.     Mental Status: He is alert and oriented to person, place, and time. Mental status is at baseline.     GCS: GCS eye subscore is 4. GCS verbal subscore is 5. GCS motor subscore is 6.     Cranial Nerves: Cranial nerves are intact. No cranial nerve deficit, dysarthria or facial asymmetry.     Sensory: Sensation is intact. No sensory deficit.     Motor: Motor function is intact. No weakness, tremor, atrophy, abnormal muscle tone or seizure activity.     Coordination: Coordination is intact. Coordination normal.     Gait: Gait is intact. Gait normal.     Comments: In/out of chair and on/off exam table without difficulty; gait sure and steady in clinic; sitting to supine and reverse quickly without assistance on exam table; bilateral hand grasp equal 5/5  Psychiatric:        Attention and Perception: Attention and  perception normal.        Mood and Affect: Mood and affect normal.        Speech: Speech normal.        Behavior: Behavior normal. Behavior is cooperative.        Thought Content: Thought content normal.        Cognition and Memory: Cognition and memory normal.        Judgment: Judgment normal.  1900 patient notified NP home covid test negative.  Discussed with patient if new or worsening symptoms will retest in 2 days.  If symptoms continue as per today retest in 3-5 days. Awaiting patient response to meclizine and nasal saline.  Cleared to return onsite 10/25/20 as long as symptoms improving and no new symptoms.     Assessment & Plan:   A-benign positional vertigo right initial visit, eustachian tube dysfunction bilaterally  P-Leaving work to go home and covid test and rest as not feeling well.  Discussed if negative covid test and improving symptoms may return to work tomorrow.   Patient to email [email or web address] or call [phone].  I will call him tomorrow to follow up.  Discussed with patient otitis media with effusion probably causing vertigo but could also be age. Supportive treatment may take up to 4 doses meclizine '25mg'$  per day max '100mg'$  per 24 hours. Electronic Rx #[id number] sent to his pharmacy of choice.  Discussed medication can cause drowsiness will take first dose at home and no driving within 8 hours of dose.  Someone to drive him home recommended not driving during vertigo episodes. Follow up if aphasia, dysphasia, visual changes, weakness, fall, worst headache of life, incoordination, fever, ear discharge. Consider ENT evaluation/follow up with PCM if worsening symptoms not controlled with meclizine.  Exitcare handouts on vertigo benign positional, sinus rinse, URI viral, covid 19 infection what to do, and sinusitis printed and given to patient.   Patient verbalized understanding of information/agreed with plan of care and had no further questions at this time.    Consider  flonase 1 spray each nostril BID OTC, saline 2 sprays each nostril q2h wa prn congestion given 1 bottle from clinic stock.  If no improvement with nasal saline and meclizine consider adding flonase/oral steroids.  Based on exam this appears to be viral sinusitis.  Denied personal or family history of ENT cancer.  Shower BID especially prior to bed. No evidence of systemic bacterial infection, non toxic and well hydrated.  I do not see where any further testing or imaging is necessary at this time.   I will suggest supportive care, rest, good hygiene and encourage the patient to take adequate fluids.  The patient is to return to clinic or EMERGENCY ROOM if symptoms worsen or change significantly.  Exitcare handouts on sinusitis and sinus rinse.  Patient verbalized agreement and understanding of treatment plan and had no further questions at this time.   P2:  Hand washing and cover cough    No evidence of invasive bacterial infection, non toxic and well hydrated.  I do not see where any further testing or imaging is necessary at this time.   I will suggest supportive care, rest, good hygiene and encourage the patient to take adequate fluids.  The patient is to return to clinic or EMERGENCY ROOM if symptoms worsen or change significantly e.g. ear pain, fever, purulent discharge from ears or bleeding.   Discussed with patient post nasal drip irritates throat/causes swelling blocks eustachian tubes from draining and fluid fills up middle ear.  Bacteria/viruses can grow in fluid and with moving head tube compressed and increases pressure in tube/ear worsening pain.  Studies show will take 30 days for fluid to resolve after post nasal drip controlled with nasal steroid/antihistamine. Antibiotics and steroids do not speed up fluid removal.  Patient verbalized agreement and understanding of treatment plan and had no further questions at this time.

## 2020-10-24 NOTE — Patient Instructions (Signed)
Viral Respiratory Infection A respiratory infection is an illness that affects part of the respiratory system, such as the lungs, nose, or throat. A respiratory infection that iscaused by a virus is called a viral respiratory infection. Common types of viral respiratory infections include: A cold. The flu (influenza). A respiratory syncytial virus (RSV) infection. What are the causes? This condition is caused by a virus. What are the signs or symptoms? Symptoms of this condition include: A stuffy or runny nose. Yellow or green nasal discharge. A cough. Sneezing. Fatigue. Achy muscles. A sore throat. Sweating or chills. A fever. A headache. How is this diagnosed? This condition may be diagnosed based on: Your symptoms. A physical exam. Testing of nasal swabs. How is this treated? This condition may be treated with medicines, such as: Antiviral medicine. This may shorten the length of time a person has symptoms. Expectorants. These make it easier to cough up mucus. Decongestant nasal sprays. Acetaminophen or NSAIDs to relieve fever and pain. Antibiotic medicines are not prescribed for viral infections. This is because antibiotics are designed to kill bacteria. They are noteffective against viruses. Follow these instructions at home:  Managing pain and congestion Take over-the-counter and prescription medicines only as told by your health care provider. If you have a sore throat, gargle with a salt-water mixture 3-4 times a day or as needed. To make a salt-water mixture, completely dissolve -1 tsp of salt in 1 cup of warm water. Use nose drops made from salt water to ease congestion and soften raw skin around your nose. Drink enough fluid to keep your urine pale yellow. This helps prevent dehydration and helps loosen up mucus. General instructions Rest as much as possible. Do not drink alcohol. Do not use any products that contain nicotine or tobacco, such as cigarettes and  e-cigarettes. If you need help quitting, ask your health care provider. Keep all follow-up visits as told by your health care provider. This is important. How is this prevented?  Get an annual flu shot. You may get the flu shot in late summer, fall, or winter. Ask your health care provider when you should get your flu shot. Avoid exposing others to your respiratory infection. Stay home from work or school as told by your health care provider. Wash your hands with soap and water often, especially after you cough or sneeze. If soap and water are not available, use alcohol-based hand sanitizer. Avoid contact with people who are sick during cold and flu season. This is generally fall and winter. Contact a health care provider if: Your symptoms last for 10 days or longer. Your symptoms get worse over time. You have a fever. You have severe sinus pain in your face or forehead. The glands in your jaw or neck become very swollen. Get help right away if you: Feel pain or pressure in your chest. Have shortness of breath. Faint or feel like you will faint. Have severe and persistent vomiting. Feel confused or disoriented. Summary A respiratory infection is an illness that affects part of the respiratory system, such as the lungs, nose, or throat. A respiratory infection that is caused by a virus is called a viral respiratory infection. Common types of viral respiratory infections are a cold, influenza, and respiratory syncytial virus (RSV) infection. Symptoms of this condition include a stuffy or runny nose, cough, sneezing, fatigue, achy muscles, sore throat, and fevers or chills. Antibiotic medicines are not prescribed for viral infections. This is because antibiotics are designed to kill bacteria. They  are not effective against viruses. This information is not intended to replace advice given to you by your health care provider. Make sure you discuss any questions you have with your healthcare  provider. Document Revised: 03/19/2018 Document Reviewed: 04/21/2017 Elsevier Patient Education  2022 [place]: What to Do if You Are Sick CDC has updated isolation and quarantine recommendations for the public, and is revising the CDC website to reflect these changes. These recommendations do not apply to healthcare personnel and do not supersede state, local, tribal, or territorial laws, rules, andregulations. If you have a fever, cough or other symptoms, you might have COVID-19. Most people have mild illness and are able to recover at home. If you are sick: Keep track of your symptoms. If you have an emergency warning sign (including trouble breathing), call 911. Steps to help prevent the spread of COVID-19 if you are sick If you are sick with COVID-19 or think you might have COVID-19, follow the steps below to care for yourself and to help protect other peoplein your home and community. Stay home except to get medical care Stay home. Most people with COVID-19 have mild illness and can recover at home without medical care. Do not leave your home, except to get medical care. Do not visit public areas. Take care of yourself. Get rest and stay hydrated. Take over-the-counter medicines, such as acetaminophen, to help you feel better. Stay in touch with your doctor. Call before you get medical care. Be sure to get care if you have trouble breathing, or have any other emergency warning signs, or if you think it is an emergency. Avoid public transportation, ride-sharing, or taxis. Separate yourself from other people As much as possible, stay in a specific room and away from other people and pets in your home. If possible, you should use a separate bathroom. If you need to be around other people or animals in oroutside of the home, wear a mask. Tell your close contactsthat they may have been exposed to COVID-19. An infected person can spread COVID-19 starting 48 hours (or 2 days) before  the person has any symptoms or tests positive. By letting your close contacts know they may have been exposed to COVID-19, you are helping to protect everyone. Additional guidance is available for those living in close quarters and shared housing. See COVID-19 and Animals if you have questions about pets. If you are diagnosed with COVID-19, someone from the health department may call you. Answer the call to slow the spread. Monitor your symptoms Symptoms of COVID-19 include fever, cough, or other symptoms. Follow care instructions from your healthcare provider and local health department. Your local health authorities may give instructions on checking your symptoms and reporting information. When to seek emergency medical attention Look for emergency warning signs* for COVID-19. If someone is showing any of these signs, seek emergency medical care immediately: Trouble breathing Persistent pain or pressure in the chest New confusion Inability to wake or stay awake Pale, gray, or blue-colored skin, lips, or nail beds, depending on skin tone *This list is not all possible symptoms. Please call your medical provider forany other symptoms that are severe or concerning to you. Call 911 or call ahead to your local emergency facility: Notify the operator that you are seeking care for someone who has or may haveCOVID-19. Call ahead before visiting your doctor Call ahead. Many medical visits for routine care are being postponed or done by phone or telemedicine. If you have a medical appointment  that cannot be postponed, call your doctor's office, and tell them you have or may have COVID-19. This will help the office protect themselves and other patients. Get tested If you have symptoms of COVID-19, get tested. While waiting for test results, you stay away from others, including staying apart from those living in your household. Self-tests are one of several options for testing for the virus that causes  COVID-19 and may be more convenient than laboratory-based tests and point-of-care tests. Ask your healthcare provider or your local health department if you need help interpreting your test results. You can visit your state, tribal, local, and territorial health department's website to look for the latest local information on testing sites. If you are sick, wear a mask over your nose and mouth You should wear a mask over your nose and mouth if you must be around other people or animals, including pets (even at home). You don't need to wear the mask if you are alone. If you can't put on a mask (because of trouble breathing, for example), cover your coughs and sneezes in some other way. Try to stay at least 6 feet away from other people. This will help protect the people around you. Masks should not be placed on young children under age 14 years, anyone who has trouble breathing, or anyone who is not able to remove the mask without help. Note: During the COVID-19 pandemic, medical grade facemasks are reserved forhealthcare workers and some first responders. Cover your coughs and sneezes Cover your mouth and nose with a tissue when you cough or sneeze. Throw away used tissues in a lined trash can. Immediately wash your hands with soap and water for at least 20 seconds. If soap and water are not available, clean your hands with an alcohol-based hand sanitizer that contains at least 60% alcohol. Clean your hands often Wash your hands often with soap and water for at least 20 seconds. This is especially important after blowing your nose, coughing, or sneezing; going to the bathroom; and before eating or preparing food. Use hand sanitizer if soap and water are not available. Use an alcohol-based hand sanitizer with at least 60% alcohol, covering all surfaces of your hands and rubbing them together until they feel dry. Soap and water are the best option, especially if hands are visibly dirty. Avoid touching  your eyes, nose, and mouth with unwashed hands. Handwashing Tips Avoid sharing personal household items Do not share dishes, drinking glasses, cups, eating utensils, towels, or bedding with other people in your home. Wash these items thoroughly after using them with soap and water or put in the dishwasher. Clean all "high-touch" surfaces every day Clean and disinfect high-touch surfaces in your "sick room" and bathroom; wear disposable gloves. Let someone else clean and disinfect surfaces in common areas, but you should clean your bedroom and bathroom, if possible. If a caregiver or other person needs to clean and disinfect a sick person's bedroom or bathroom, they should do so on an as-needed basis. The caregiver/other person should wear a mask and disposable gloves prior to cleaning. They should wait as long as possible after the person who is sick has used the bathroom before coming in to clean and use the bathroom. High-touch surfaces include phones, remote controls, counters, tabletops, doorknobs, bathroom fixtures, toilets, keyboards, tablets, and bedside tables. Clean and disinfect areas that may have blood, stool, or body fluids on them. Use household cleaners and disinfectants. Clean the area or item with soap and  water or another detergent if it is dirty. Then, use a household disinfectant. Be sure to follow the instructions on the label to ensure safe and effective use of the product. Many products recommend keeping the surface wet for several minutes to ensure germs are killed. Many also recommend precautions such as wearing gloves and making sure you have good ventilation during use of the product. Use a product from [employer]: Disinfectants for Coronavirus ([id number]). Complete Disinfection Guidance When you can be around others after being sick with COVID-19 Deciding when you can be around others is different for different situations. Find out when you can safely end home  isolation. For any additional questions about your care,contact your healthcare provider or state or local health department. 03/01/2020 Content source: [facility] for Immunization and Respiratory Diseases ([facility]), Division of Viral Diseases This information is not intended to replace advice given to you by your health care provider. Make sure you discuss any questions you have with your healthcare provider. Document Revised: 04/28/2020 Document Reviewed: 04/28/2020 Elsevier Patient Education  [place]. Sinusitis, Adult Sinusitis is inflammation of your sinuses. Sinuses are hollow spaces in the bones around your face. Your sinuses are located: Around your eyes. In the middle of your forehead. Behind your nose. In your cheekbones. Mucus normally drains out of your sinuses. When your nasal tissues become inflamed or swollen, mucus can become trapped or blocked. This allows bacteria, viruses, and fungi to grow, which leads to infection. Most infections of thesinuses are caused by a virus. Sinusitis can develop quickly. It can last for up to 4 weeks (acute) or for more than 12 weeks (chronic). Sinusitis often develops after a cold. What are the causes? This condition is caused by anything that creates swelling in the sinuses or stops mucus from draining. This includes: Allergies. Asthma. Infection from bacteria or viruses. Deformities or blockages in your nose or sinuses. Abnormal growths in the nose (nasal polyps). Pollutants, such as chemicals or irritants in the air. Infection from fungi (rare). What increases the risk? You are more likely to develop this condition if you: Have a weak body defense system (immune system). Do a lot of swimming or diving. Overuse nasal sprays. Smoke. What are the signs or symptoms? The main symptoms of this condition are pain and a feeling of pressure around the affected sinuses. Other symptoms include: Stuffy nose or congestion. Thick  drainage from your nose. Swelling and warmth over the affected sinuses. Headache. Upper toothache. A cough that may get worse at night. Extra mucus that collects in the throat or the back of the nose (postnasal drip). Decreased sense of smell and taste. Fatigue. A fever. Sore throat. Bad breath. How is this diagnosed? This condition is diagnosed based on: Your symptoms. Your medical history. A physical exam. Tests to find out if your condition is acute or chronic. This may include: Checking your nose for nasal polyps. Viewing your sinuses using a device that has a light (endoscope). Testing for allergies or bacteria. Imaging tests, such as an MRI or CT scan. In rare cases, a bone biopsy may be done to rule out more serious types offungal sinus disease. How is this treated? Treatment for sinusitis depends on the cause and whether your condition is chronic or acute. If caused by a virus, your symptoms should go away on their own within 10 days. You may be given medicines to relieve symptoms. They include: Medicines that shrink swollen nasal passages (topical intranasal decongestants). Medicines that  treat allergies (antihistamines). A spray that eases inflammation of the nostrils (topical intranasal corticosteroids). Rinses that help get rid of thick mucus in your nose (nasal saline washes). If caused by bacteria, your health care provider may recommend waiting to see if your symptoms improve. Most bacterial infections will get better without antibiotic medicine. You may be given antibiotics if you have: A severe infection. A weak immune system. If caused by narrow nasal passages or nasal polyps, you may need to have surgery. Follow these instructions at home: Medicines Take, use, or apply over-the-counter and prescription medicines only as told by your health care provider. These may include nasal sprays. If you were prescribed an antibiotic medicine, take it as told by your health  care provider. Do not stop taking the antibiotic even if you start to feel better. Hydrate and humidify  Drink enough fluid to keep your urine pale yellow. Staying hydrated will help to thin your mucus. Use a cool mist humidifier to keep the humidity level in your home above 50%. Inhale steam for 10-15 minutes, 3-4 times a day, or as told by your health care provider. You can do this in the bathroom while a hot shower is running. Limit your exposure to cool or dry air.  Rest Rest as much as possible. Sleep with your head raised (elevated). Make sure you get enough sleep each night. General instructions  Apply a warm, moist washcloth to your face 3-4 times a day or as told by your health care provider. This will help with discomfort. Wash your hands often with soap and water to reduce your exposure to germs. If soap and water are not available, use hand sanitizer. Do not smoke. Avoid being around people who are smoking (secondhand smoke). Keep all follow-up visits as told by your health care provider. This is important.  Contact a health care provider if: You have a fever. Your symptoms get worse. Your symptoms do not improve within 10 days. Get help right away if: You have a severe headache. You have persistent vomiting. You have severe pain or swelling around your face or eyes. You have vision problems. You develop confusion. Your neck is stiff. You have trouble breathing. Summary Sinusitis is soreness and inflammation of your sinuses. Sinuses are hollow spaces in the bones around your face. This condition is caused by nasal tissues that become inflamed or swollen. The swelling traps or blocks the flow of mucus. This allows bacteria, viruses, and fungi to grow, which leads to infection. If you were prescribed an antibiotic medicine, take it as told by your health care provider. Do not stop taking the antibiotic even if you start to feel better. Keep all follow-up visits as told by  your health care provider. This is important. This information is not intended to replace advice given to you by your health care provider. Make sure you discuss any questions you have with your healthcare provider. Document Revised: 08/11/2017 Document Reviewed: 08/11/2017 Elsevier Patient Education  2022 [place]. How to Perform a Sinus Rinse A sinus rinse is a home treatment that is used to rinse your sinuses with a sterile mixture of salt and water (saline solution). Sinuses are air-filled spaces in your skull behind the bones of your faceand forehead that open into your nasal cavity. A sinus rinse can help to clear mucus, dirt, dust, or pollen from your nasal cavity. You may do a sinus rinse when you have a cold, a virus, nasal allergysymptoms, a sinus infection, or stuffiness  in your nose or sinuses. Talk with your health care provider about whether a sinus rinse might help you. What are the risks? A sinus rinse is generally safe and effective. However, there are a few risks, which include: A burning sensation in your sinuses. This may happen if you do not make the saline solution as directed. Be sure to follow all directions when making the saline solution. Nasal irritation. Infection from contaminated water. This is rare, but possible. Do not do a sinus rinse if you have had ear or nasal surgery, ear infection, orblocked ears. Supplies needed: Saline solution or powder. Distilled or sterile water to mix with saline powder. You may use boiled and cooled tap water. Boil tap water for 5 minutes; cool until it is lukewarm. Use within 24 hours. Do not use regular tap water to mix with the saline solution. Neti pot or nasal rinse bottle. These supplies release the saline solution into your nose and through your sinuses. Neti pots and nasal rinse bottles can be purchased at [employer], a health food store, or online. How to perform a sinus rinse  Wash your hands with soap and  water. Wash your device according to the directions that came with the product and then dry it. Use the solution that comes with your product or one that is sold separately in stores. Follow the mixing directions on the package to mix with sterile or distilled water. Fill the device with the amount of saline solution noted in the device instructions. Stand over a sink and tilt your head sideways over the sink. Place the spout of the device in your upper nostril (the one closer to the ceiling). Gently pour or squeeze the saline solution into your nasal cavity. The liquid should drain out from the lower nostril if you are not too congested. While rinsing, breathe through your open mouth. Gently blow your nose to clear any mucus and rinse solution. Blowing too hard may cause ear pain. Repeat in your other nostril. Clean and rinse your device with clean water and then air-dry it. Talk with your health care provider or pharmacist if you have questions abouthow to do a sinus rinse. Summary A sinus rinse is a home treatment that is used to rinse your sinuses with a sterile mixture of salt and water (saline solution). A sinus rinse is generally safe and effective. Follow all instructions carefully. Before doing a sinus rinse, talk with your health care provider about whether it would be helpful for you. This information is not intended to replace advice given to you by your health care provider. Make sure you discuss any questions you have with your healthcare provider. Document Revised: 12/21/2019 Document Reviewed: 12/21/2019 Elsevier Patient Education  2022 [place]. Benign Positional Vertigo Vertigo is the feeling that you or your surroundings are moving when they are not. Benign positional vertigo is the most common form of vertigo. This is usually a harmless condition (benign). This condition is positional. This means that symptoms are triggered bycertain movements and positions. This  condition can be dangerous if it occurs while you are doing something that could cause harm to yourself or others. This includes activities such asdriving or operating machinery. What are the causes? The inner ear has fluid-filled canals that help your brain sense movement and balance. When the fluid moves, the brain receives messages about your body'sposition. With benign positional vertigo, calcium crystals in the inner ear break free and disturb the inner ear area. This causes  your brain to receive confusingmessages about your body's position. What increases the risk? You are more likely to develop this condition if: You are a woman. You are 64 years of age or older. You have recently had a head injury. You have an inner ear disease. What are the signs or symptoms? Symptoms of this condition usually happen when you move your head or your eyes in different directions. Symptoms may start suddenly and usually last for less than a minute. They include: Loss of balance and falling. Feeling like you are spinning or moving. Feeling like your surroundings are spinning or moving. Nausea and vomiting. Blurred vision. Dizziness. Involuntary eye movement (nystagmus). Symptoms can be mild and cause only minor problems, or they can be severe and interfere with daily life. Episodes of benign positional vertigo may return (recur) over time. Symptoms may also improve over time. How is this diagnosed? This condition may be diagnosed based on: Your medical history. A physical exam of the head, neck, and ears. Positional tests to check for or stimulate vertigo. You may be asked to turn your head and change positions, such as going from sitting to lying down. A health care provider will watch for symptoms of vertigo. You may be referred to a health care provider who specializes in ear, nose, and throat problems (ENT or otolaryngologist) or a provider who specializes in disorders of the nervous system  (neurologist). How is this treated?  This condition may be treated in a session in which your health care provider moves your head in specific positions to help the displaced crystals in your inner ear move. Treatment for this condition may take several sessions. Surgerymay be needed in severe cases, but this is rare. In some cases, benign positional vertigo may resolve on its own in 2-4 weeks. Follow these instructions at home: Safety Move slowly. Avoid sudden body or head movements or certain positions, as told by your health care provider. Avoid driving or operating machinery until your health care provider says it is safe. Avoid doing any tasks that would be dangerous to you or others if vertigo occurs. If you have trouble walking or keeping your balance, try using a cane for stability. If you feel dizzy or unstable, sit down right away. Return to your normal activities as told by your health care provider. Ask your health care provider what activities are safe for you. General instructions Take over-the-counter and prescription medicines only as told by your health care provider. Drink enough fluid to keep your urine pale yellow. Keep all follow-up visits. This is important. Contact a health care provider if: You have a fever. Your condition gets worse or you develop new symptoms. Your family or friends notice any behavioral changes. You have nausea or vomiting that gets worse. You have numbness or a prickling and tingling sensation. Get help right away if you: Have difficulty speaking or moving. Are always dizzy or faint. Develop severe headaches. Have weakness in your legs or arms. Have changes in your hearing or vision. Develop a stiff neck. Develop sensitivity to light. These symptoms may represent a serious problem that is an emergency. Do not wait to see if the symptoms will go away. Get medical help right away. Call your local emergency services (911 in the [place] Do not  drive yourself to the hospital. Summary Vertigo is the feeling that you or your surroundings are moving when they are not. Benign positional vertigo is the most common form of vertigo. This condition is caused  by calcium crystals in the inner ear that become displaced. This causes a disturbance in an area of the inner ear that helps your brain sense movement and balance. Symptoms include loss of balance and falling, feeling that you or your surroundings are moving, nausea and vomiting, and blurred vision. This condition can be diagnosed based on symptoms, a physical exam, and positional tests. Follow safety instructions as told by your health care provider and keep all follow-up visits. This is important. This information is not intended to replace advice given to you by your health care provider. Make sure you discuss any questions you have with your healthcare provider. Document Revised: 02/09/2020 Document Reviewed: 02/09/2020 Elsevier Patient Education  2022 [employer].

## 2020-11-28 ENCOUNTER — Other Ambulatory Visit: Payer: Self-pay | Admitting: Internal Medicine

## 2020-11-28 DIAGNOSIS — I10 Essential (primary) hypertension: Secondary | ICD-10-CM

## 2020-12-26 ENCOUNTER — Telehealth: Payer: Self-pay | Admitting: Internal Medicine

## 2020-12-26 NOTE — Telephone Encounter (Signed)
Patient needs to r/s October 11th appt.

## 2021-01-02 ENCOUNTER — Ambulatory Visit: Payer: PRIVATE HEALTH INSURANCE | Admitting: Internal Medicine

## 2021-01-15 ENCOUNTER — Other Ambulatory Visit: Payer: Self-pay | Admitting: Internal Medicine

## 2021-01-15 DIAGNOSIS — I10 Essential (primary) hypertension: Secondary | ICD-10-CM

## 2021-01-16 ENCOUNTER — Ambulatory Visit: Payer: No Typology Code available for payment source | Admitting: Internal Medicine

## 2021-01-16 ENCOUNTER — Encounter: Payer: Self-pay | Admitting: Internal Medicine

## 2021-01-16 ENCOUNTER — Other Ambulatory Visit: Payer: Self-pay

## 2021-01-16 VITALS — BP 150/80 | HR 66 | Temp 97.9°F | Ht 69.0 in | Wt 246.5 lb

## 2021-01-16 DIAGNOSIS — I10 Essential (primary) hypertension: Secondary | ICD-10-CM | POA: Diagnosis not present

## 2021-01-16 DIAGNOSIS — Z23 Encounter for immunization: Secondary | ICD-10-CM | POA: Diagnosis not present

## 2021-01-16 DIAGNOSIS — E669 Obesity, unspecified: Secondary | ICD-10-CM | POA: Diagnosis not present

## 2021-01-16 MED ORDER — CLONIDINE HCL 0.1 MG PO TABS: 0.1000 mg | ORAL_TABLET | Freq: Two times a day (BID) | ORAL | 1 refills | Status: DC

## 2021-01-16 NOTE — Patient Instructions (Signed)
-  Nice seeing you today!!  -Flu vaccine today.  -Start clonidine 0.1 mg twice daily.  -Schedule follow up in 8 weeks.

## 2021-01-16 NOTE — Progress Notes (Signed)
Established Patient Office Visit     This visit occurred during the SARS-CoV-2 public health emergency.  Safety protocols were in place, including screening questions prior to the visit, additional usage of staff PPE, and extensive cleaning of exam room while observing appropriate contact time as indicated for disinfecting solutions.    CC/Reason for Visit: Blood pressure follow-up  HPI: Jackson Alexander is a 65 y.o. male who is coming in today for the above mentioned reasons. Past Medical History is significant for: Hypertension and obesity.  He has blood pressure that has been difficult to control.  He is compliant with his current regimen of losartan 100 mg daily, hydrochlorothiazide 25 mg daily, metoprolol 100 mg twice daily and hydralazine 75 mg 3 times a day.  He did not tolerate amlodipine due to significant peripheral edema.  He feels well today and has no acute concerns.  Requesting flu vaccination.   Past Medical/Surgical History: Past Medical History:  Diagnosis Date   Chronic venous insufficiency    Hx of adenomatous polyp of colon 11/14/2019   10/2019 diminutive adenoma recall 2028   Hypertension    Obesity (BMI 30-39.9)     Past Surgical History:  Procedure Laterality Date   COLONOSCOPY  07/26/2008   Dr.Orr-hyperplastic  polyps    Social History:  reports that he has never smoked. He has never used smokeless tobacco. He reports current alcohol use of about 1.0 standard drink per week. He reports that he does not use drugs.  Allergies: No Known Allergies  Family History:  Family History  Problem Relation Age of Onset   Lung cancer Father    Breast cancer Sister    Colon polyps Sister    Renal cancer Brother    Colon polyps Brother    Breast cancer Maternal Grandmother    Prostate cancer Maternal Grandfather    Breast cancer Maternal Aunt    Renal cancer Maternal Uncle    Melanoma Brother    Breast cancer Sister    Breast cancer Sister    Colon  cancer Neg Hx    Esophageal cancer Neg Hx    Stomach cancer Neg Hx    Rectal cancer Neg Hx      Current Outpatient Medications:    cloNIDine (CATAPRES) 0.1 MG tablet, Take 1 tablet (0.1 mg total) by mouth 2 (two) times daily., Disp: 180 tablet, Rfl: 1   fexofenadine (ALLEGRA) 180 MG tablet, Take 180 mg by mouth daily., Disp: , Rfl:    hydrALAZINE (APRESOLINE) 50 MG tablet, TAKE 1.5 TABLETS (75 MG TOTAL) BY MOUTH 3 (THREE) TIMES DAILY., Disp: 270 tablet, Rfl: 1   losartan-hydrochlorothiazide (HYZAAR) 100-25 MG tablet, TAKE 1 TABLET BY MOUTH EVERY DAY, Disp: 90 tablet, Rfl: 0   metoprolol tartrate (LOPRESSOR) 100 MG tablet, TAKE 1 TABLET BY MOUTH TWICE A DAY, Disp: 180 tablet, Rfl: 1   OVER THE COUNTER MEDICATION, Vitamin D3 50 mcg 2,000 iu per day, Disp: , Rfl:    sodium chloride (OCEAN) 0.65 % SOLN nasal spray, Place 2 sprays into both nostrils every 2 (two) hours while awake., Disp: , Rfl: 0  Review of Systems:  Constitutional: Denies fever, chills, diaphoresis, appetite change and fatigue.  HEENT: Denies photophobia, eye pain, redness, hearing loss, ear pain, congestion, sore throat, rhinorrhea, sneezing, mouth sores, trouble swallowing, neck pain, neck stiffness and tinnitus.   Respiratory: Denies SOB, DOE, cough, chest tightness,  and wheezing.   Cardiovascular: Denies chest pain, palpitations and leg swelling.  Gastrointestinal: Denies nausea, vomiting, abdominal pain, diarrhea, constipation, blood in stool and abdominal distention.  Genitourinary: Denies dysuria, urgency, frequency, hematuria, flank pain and difficulty urinating.  Endocrine: Denies: hot or cold intolerance, sweats, changes in hair or nails, polyuria, polydipsia. Musculoskeletal: Denies myalgias, back pain, joint swelling, arthralgias and gait problem.  Skin: Denies pallor, rash and wound.  Neurological: Denies dizziness, seizures, syncope, weakness, light-headedness, numbness and headaches.  Hematological: Denies  adenopathy. Easy bruising, personal or family bleeding history  Psychiatric/Behavioral: Denies suicidal ideation, mood changes, confusion, nervousness, sleep disturbance and agitation    Physical Exam: Vitals:   01/16/21 1452  BP: (!) 150/80  Pulse: 66  Temp: 97.9 F (36.6 C)  TempSrc: Oral  SpO2: 96%  Weight: 246 lb 8 oz (111.8 kg)  Height: 5\' 9"  (1.753 m)    Body mass index is 36.4 kg/m.   Constitutional: NAD, calm, comfortable Eyes: PERRL, lids and conjunctivae normal, wears corrective lenses ENMT: Mucous membranes are moist.  Respiratory: clear to auscultation bilaterally, no wheezing, no crackles. Normal respiratory effort. No accessory muscle use.  Cardiovascular: Regular rate and rhythm, no murmurs / rubs / gallops. No extremity edema.  Neurologic: Grossly intact and nonfocal. Psychiatric: Normal judgment and insight. Alert and oriented x 3. Normal mood.    Impression and Plan:  Primary hypertension -Uncontrolled. -Add clonidine 0.1 mg twice daily to his above medications, if still difficult to control may consider referral to cardiology.  Obesity (BMI 30-39.9) -Discussed healthy lifestyle, including increased physical activity and better food choices to promote weight loss.   Need for influenza vaccination -Flu vaccine today.  Time spent: 31 minutes reviewing chart, interviewing and examining patient and formulating plan of care.   Patient Instructions  -Nice seeing you today!!  -Flu vaccine today.  -Start clonidine 0.1 mg twice daily.  -Schedule follow up in 8 weeks.   Lelon Frohlich, MD [facility] at [facility]

## 2021-01-30 ENCOUNTER — Telehealth: Payer: Self-pay | Admitting: Internal Medicine

## 2021-01-30 ENCOUNTER — Other Ambulatory Visit: Payer: Self-pay | Admitting: Internal Medicine

## 2021-01-30 DIAGNOSIS — I10 Essential (primary) hypertension: Secondary | ICD-10-CM

## 2021-01-30 NOTE — Telephone Encounter (Signed)
Patient called because he has run out of the hydrALAZINE (APRESOLINE) 50 MG tablet Patient states that he always runs out before it is time because he has to take 1.5 tablets three times a day. Patient states that pharmacy told him he needs to get a new prescription to get refills.  Patient will run out of medication on Sunday and the next refill is not until December.    Please send to [id number] - [place], [place] - [address] Phone:  [phone]  Fax:  [phone]       Please advise

## 2021-01-31 MED ORDER — HYDRALAZINE HCL 50 MG PO TABS: 75.0000 mg | ORAL_TABLET | Freq: Three times a day (TID) | ORAL | 1 refills | Status: DC

## 2021-01-31 NOTE — Telephone Encounter (Signed)
Refill sent.

## 2021-02-07 ENCOUNTER — Telehealth: Payer: Self-pay | Admitting: Registered Nurse

## 2021-02-07 ENCOUNTER — Encounter: Payer: Self-pay | Admitting: Registered Nurse

## 2021-02-07 NOTE — Telephone Encounter (Signed)
Patient contacted via telephone reported last vomiting yesterday.  Still having nausea today.  Denied dizziness, headache, vomiting, fever, chills, diarrhea or URI symptoms. Drank coffee this morning like normal.  Yesterday had coffee and cereal for breakfast and cannot remember what he ate the rest of day.  Was drinking water at work yesterday.  Had some chills yesterday along with nausea then vomiting.  Denied sick contacts.  Took home covid test negative yesterday.  Urinating normal amount/frequency per patient.   I have recommended clear fluids and bland diet.  Avoid dairy/spicy, fried and large portions of meat while having nausea.  If vomiting hold po intake x 1 hour.  Then sips clear fluids like broths, ginger ale, power ade, gatorade, pedialyte may advance to soft/bland if no vomiting x 24 hours and appetite returned otherwise hydration main focus.  Patient did not want Rx for zofran at this time.  Notify clinic staff if symptoms persist or worsen; I have alerted the patient to call if high fever, dehydration, marked weakness, fainting, increased abdominal pain, blood in stool or vomit (red or black).   Exitcare handout on nausea/vomiting.  Recommended water/gatorade/nondairy popsicles/ginger ale/broths.  HR notified patient symptoms improving and cleared to return 02/08/21 as long as no fever/vomiting/diarrhea toady.  Patient to call out sick if these occur later today for tomorrow.  Discussed RN Hildred Alamin will follow up with him via telephone tomorrow unless he contacts me at [phone].  Patient A&Ox3 spoke full sentences without difficulty no cough/nasal congestion/throat clearing noted during 5 minute telephone call.   Patient verbalized agreement and understanding of treatment plan and had no further questions at this time.

## 2021-02-07 NOTE — Telephone Encounter (Signed)
HR Tim notified NP patient sent home for vomiting at work and to contact patient.  Left message for patient to contact NP [phone].  Notified patient medication available for vomiting and I can send in Rx for him

## 2021-02-08 NOTE — Telephone Encounter (Signed)
Reviewed RN Hildred Alamin note all symptoms resolved and returned to work today.  Encounter closed.

## 2021-02-08 NOTE — Telephone Encounter (Signed)
Spoke with pt by phone. He reports all sx resolved Tuesday evening and have not recurred. He did RTW today 11/17 as expected. Denies further needs or concerns. Closing encounter.

## 2021-03-13 ENCOUNTER — Ambulatory Visit: Payer: No Typology Code available for payment source | Admitting: Internal Medicine

## 2021-03-13 ENCOUNTER — Encounter: Payer: Self-pay | Admitting: Internal Medicine

## 2021-03-13 VITALS — BP 146/80 | HR 60 | Temp 97.7°F | Ht 69.0 in | Wt 244.9 lb

## 2021-03-13 DIAGNOSIS — I10 Essential (primary) hypertension: Secondary | ICD-10-CM | POA: Diagnosis not present

## 2021-03-13 NOTE — Progress Notes (Signed)
Established Patient Office Visit     This visit occurred during the SARS-CoV-2 public health emergency.  Safety protocols were in place, including screening questions prior to the visit, additional usage of staff PPE, and extensive cleaning of exam room while observing appropriate contact time as indicated for disinfecting solutions.    CC/Reason for Visit: Blood pressure follow-up  HPI: Jackson Alexander is a 65 y.o. male who is coming in today for the above mentioned reasons. Past Medical History is significant for: Hypertension and obesity.  We have been seeing him quite frequently for hypertension management.  He is compliant with medication.  He is now on losartan 100 mg daily, hydrochlorothiazide 25 mg daily, metoprolol 100 mg twice daily, hydralazine 75 mg 3 times a day. At last visit clonidine 0.1 mg twice daily was added, this was on October 25th.  He did not tolerate amlodipine due to peripheral edema even at lowest dose.  He feels well and has no concerns.  In office blood pressure today is 146/80 which is concordant with his ambulatory measurements.   Past Medical/Surgical History: Past Medical History:  Diagnosis Date   Chronic venous insufficiency    Hx of adenomatous polyp of colon 11/14/2019   10/2019 diminutive adenoma recall 2028   Hypertension    Obesity (BMI 30-39.9)     Past Surgical History:  Procedure Laterality Date   COLONOSCOPY  07/26/2008   Dr.Orr-hyperplastic  polyps    Social History:  reports that he has never smoked. He has never used smokeless tobacco. He reports current alcohol use of about 1.0 standard drink per week. He reports that he does not use drugs.  Allergies: No Known Allergies  Family History:  Family History  Problem Relation Age of Onset   Lung cancer Father    Breast cancer Sister    Colon polyps Sister    Renal cancer Brother    Colon polyps Brother    Breast cancer Maternal Grandmother    Prostate cancer Maternal  Grandfather    Breast cancer Maternal Aunt    Renal cancer Maternal Uncle    Melanoma Brother    Breast cancer Sister    Breast cancer Sister    Colon cancer Neg Hx    Esophageal cancer Neg Hx    Stomach cancer Neg Hx    Rectal cancer Neg Hx      Current Outpatient Medications:    cloNIDine (CATAPRES) 0.1 MG tablet, Take 1 tablet (0.1 mg total) by mouth 2 (two) times daily., Disp: 180 tablet, Rfl: 1   fexofenadine (ALLEGRA) 180 MG tablet, Take 180 mg by mouth daily., Disp: , Rfl:    hydrALAZINE (APRESOLINE) 50 MG tablet, Take 1.5 tablets (75 mg total) by mouth 3 (three) times daily., Disp: 405 tablet, Rfl: 1   losartan-hydrochlorothiazide (HYZAAR) 100-25 MG tablet, TAKE 1 TABLET BY MOUTH EVERY DAY, Disp: 90 tablet, Rfl: 0   metoprolol tartrate (LOPRESSOR) 100 MG tablet, TAKE 1 TABLET BY MOUTH TWICE A DAY, Disp: 180 tablet, Rfl: 1   OVER THE COUNTER MEDICATION, Vitamin D3 50 mcg 2,000 iu per day, Disp: , Rfl:    sodium chloride (OCEAN) 0.65 % SOLN nasal spray, Place 2 sprays into both nostrils every 2 (two) hours while awake., Disp: , Rfl: 0  Review of Systems:  Constitutional: Denies fever, chills, diaphoresis, appetite change and fatigue.  HEENT: Denies photophobia, eye pain, redness, hearing loss, ear pain, congestion, sore throat, rhinorrhea, sneezing, mouth sores, trouble swallowing, neck  pain, neck stiffness and tinnitus.   Respiratory: Denies SOB, DOE, cough, chest tightness,  and wheezing.   Cardiovascular: Denies chest pain, palpitations and leg swelling.  Gastrointestinal: Denies nausea, vomiting, abdominal pain, diarrhea, constipation, blood in stool and abdominal distention.  Genitourinary: Denies dysuria, urgency, frequency, hematuria, flank pain and difficulty urinating.  Endocrine: Denies: hot or cold intolerance, sweats, changes in hair or nails, polyuria, polydipsia. Musculoskeletal: Denies myalgias, back pain, joint swelling, arthralgias and gait problem.  Skin: Denies  pallor, rash and wound.  Neurological: Denies dizziness, seizures, syncope, weakness, light-headedness, numbness and headaches.  Hematological: Denies adenopathy. Easy bruising, personal or family bleeding history  Psychiatric/Behavioral: Denies suicidal ideation, mood changes, confusion, nervousness, sleep disturbance and agitation    Physical Exam: Vitals:   03/13/21 1532  BP: (!) 146/80  Pulse: 60  Temp: 97.7 F (36.5 C)  TempSrc: Oral  SpO2: 97%  Weight: 244 lb 14.4 oz (111.1 kg)  Height: 5\' 9"  (1.753 m)    Body mass index is 36.17 kg/m.   Constitutional: NAD, calm, comfortable Eyes: PERRL, lids and conjunctivae normal, wears corrective lenses ENMT: Mucous membranes are moist.  Respiratory: clear to auscultation bilaterally, no wheezing, no crackles. Normal respiratory effort. No accessory muscle use.  Cardiovascular: Regular rate and rhythm, no murmurs / rubs / gallops. No extremity edema.  Neurologic: Grossly intact and nonfocal. Psychiatric: Normal judgment and insight. Alert and oriented x 3. Normal mood.    Impression and Plan:  Primary hypertension  - Plan: [facility] -His blood pressure remains uncontrolled overnight being compliant with 5 antihypertensives. -I will refer to Dr. Oval Linsey at the hypertension clinic for further evaluation and management. ?  Work-up for secondary causes of hypertension.  Time spent: 20 minutes reviewing chart, interviewing and examining patient and formulating plan of care.     Lelon Frohlich, MD [facility] at [facility]

## 2021-04-10 ENCOUNTER — Other Ambulatory Visit: Payer: Self-pay

## 2021-04-10 ENCOUNTER — Ambulatory Visit: Payer: Self-pay | Admitting: *Deleted

## 2021-04-10 VITALS — BP 151/96 | HR 60

## 2021-04-10 DIAGNOSIS — I10 Essential (primary) hypertension: Secondary | ICD-10-CM

## 2021-04-10 NOTE — Progress Notes (Signed)
BP check per pt request. All meds taken as Rx'd. Describes feeling "weird" for past few days. Something in the chest, but unable to describe. Denies chest pain, palpitations, HA, vision changes. Does endorse very slight ShOB, noticing a little more out of breath when walking from parking lot to department. No ShOB noted in clinic. Speaking in complete sentences without difficulty. Pt feels comfortable returning to work and will f/u if any new or worsening sx.  Has initial Cards appt scheduled 04/24/21.

## 2021-04-24 ENCOUNTER — Other Ambulatory Visit: Payer: Self-pay

## 2021-04-24 ENCOUNTER — Encounter (HOSPITAL_BASED_OUTPATIENT_CLINIC_OR_DEPARTMENT_OTHER): Payer: Self-pay | Admitting: Cardiovascular Disease

## 2021-04-24 ENCOUNTER — Ambulatory Visit (HOSPITAL_BASED_OUTPATIENT_CLINIC_OR_DEPARTMENT_OTHER): Payer: No Typology Code available for payment source | Admitting: Cardiovascular Disease

## 2021-04-24 VITALS — BP 176/96 | HR 74 | Ht 69.0 in | Wt 253.0 lb

## 2021-04-24 DIAGNOSIS — I10 Essential (primary) hypertension: Secondary | ICD-10-CM

## 2021-04-24 DIAGNOSIS — E669 Obesity, unspecified: Secondary | ICD-10-CM | POA: Diagnosis not present

## 2021-04-24 DIAGNOSIS — R0683 Snoring: Secondary | ICD-10-CM

## 2021-04-24 DIAGNOSIS — Z006 Encounter for examination for normal comparison and control in clinical research program: Secondary | ICD-10-CM

## 2021-04-24 HISTORY — DX: Snoring: R06.83

## 2021-04-24 MED ORDER — SPIRONOLACTONE 25 MG PO TABS: 25.0000 mg | ORAL_TABLET | Freq: Every day | ORAL | 3 refills | Status: DC

## 2021-04-24 MED ORDER — CARVEDILOL 25 MG PO TABS: 25.0000 mg | ORAL_TABLET | Freq: Two times a day (BID) | ORAL | 3 refills | Status: DC

## 2021-04-24 MED ORDER — VALSARTAN-HYDROCHLOROTHIAZIDE 320-25 MG PO TABS: 1.0000 | ORAL_TABLET | Freq: Every day | ORAL | 3 refills | Status: DC

## 2021-04-24 NOTE — Assessment & Plan Note (Signed)
Referral to PREP.  Increase exercise to at least 150 minutes weekly.

## 2021-04-24 NOTE — Patient Instructions (Addendum)
Medication Instructions:  STOP LOSARTAN   START VALSARTAN -HCT 320-25 MG DAILY   STOP METOPROLOL   START CARVEDILOL 25 MG TWICE A DAY   START SPIRONOLACTONE 25 MG DAILY    Labwork: BMET IN 1 WEEK    Testing/Procedures: Your physician has recommended that you have a sleep study. This test records several body functions during sleep, including: brain activity, eye movement, oxygen and carbon dioxide blood levels, heart rate and rhythm, breathing rate and rhythm, the flow of air through your mouth and nose, snoring, body muscle movements, and chest and belly movement. ONCE INSURANCE HAS BEE REVIEWED THE OFFICE WILL CALL YOU TO SCHEDULE. IF YOU DO NOT HEAR FROM [place] IN 2 WEEKS CALL TO FOLLOW UP    Follow-Up: 06/27/2021 1:30 PM WITH PHARM D AT [facility]   09/03/2021 2:15 PM WITH DR [place] AT [place] will receive a phone call from the PREP exercise and nutrition program to schedule an initial assessment.  Special Instructions:   MONITOR YOUR BLOOD PRESSURE TWICE A DAY WITH MACHINE PROVIDED, MAKE SURE YOU ARE LOGGED INTO YOUR APP    LIMIT CAFFEINE TO 2 CUPS A DAY  DASH Eating Plan DASH stands for "Dietary Approaches to Stop Hypertension." The DASH eating plan is a healthy eating plan that has been shown to reduce high blood pressure (hypertension). It may also reduce your risk for type 2 diabetes, heart disease, and stroke. The DASH eating plan may also help with weight loss. What are tips for following this plan?  General guidelines Avoid eating more than 2,300 mg (milligrams) of salt (sodium) a day. If you have hypertension, you may need to reduce your sodium intake to 1,500 mg a day. Limit alcohol intake to no more than 1 drink a day for nonpregnant women and 2 drinks a day for men. One drink equals 12 oz of beer, 5 oz of wine, or 1 oz of hard liquor. Work with your health care provider to maintain a healthy body weight or to lose weight. Ask what an ideal weight  is for you. Get at least 30 minutes of exercise that causes your heart to beat faster (aerobic exercise) most days of the week. Activities may include walking, swimming, or biking. Work with your health care provider or diet and nutrition specialist (dietitian) to adjust your eating plan to your individual calorie needs. Reading food labels  Check food labels for the amount of sodium per serving. Choose foods with less than 5 percent of the Daily Value of sodium. Generally, foods with less than 300 mg of sodium per serving fit into this eating plan. To find whole grains, look for the word "whole" as the first word in the ingredient list. Shopping Buy products labeled as "low-sodium" or "no salt added." Buy fresh foods. Avoid canned foods and premade or frozen meals. Cooking Avoid adding salt when cooking. Use salt-free seasonings or herbs instead of table salt or sea salt. Check with your health care provider or pharmacist before using salt substitutes. Do not fry foods. Cook foods using healthy methods such as baking, boiling, grilling, and broiling instead. Cook with heart-healthy oils, such as olive, canola, soybean, or sunflower oil. Meal planning Eat a balanced diet that includes: 5 or more servings of fruits and vegetables each day. At each meal, try to fill half of your plate with fruits and vegetables. Up to 6-8 servings of whole grains each day. Less than 6 oz of lean meat, poultry,  or fish each day. A 3-oz serving of meat is about the same size as a deck of cards. One egg equals 1 oz. 2 servings of low-fat dairy each day. A serving of nuts, seeds, or beans 5 times each week. Heart-healthy fats. Healthy fats called Omega-3 fatty acids are found in foods such as flaxseeds and coldwater fish, like sardines, salmon, and mackerel. Limit how much you eat of the following: Canned or prepackaged foods. Food that is high in trans fat, such as fried foods. Food that is high in saturated fat,  such as fatty meat. Sweets, desserts, sugary drinks, and other foods with added sugar. Full-fat dairy products. Do not salt foods before eating. Try to eat at least 2 vegetarian meals each week. Eat more home-cooked food and less restaurant, buffet, and fast food. When eating at a restaurant, ask that your food be prepared with less salt or no salt, if possible. What foods are recommended? The items listed may not be a complete list. Talk with your dietitian about what dietary choices are best for you. Grains Whole-grain or whole-wheat bread. Whole-grain or whole-wheat pasta. Brown rice. [place]. Bulgur. Whole-grain and low-sodium cereals. Pita bread. Low-fat, low-sodium crackers. Whole-wheat flour tortillas. Vegetables Fresh or frozen vegetables (raw, steamed, roasted, or grilled). Low-sodium or reduced-sodium tomato and vegetable juice. Low-sodium or reduced-sodium tomato sauce and tomato paste. Low-sodium or reduced-sodium canned vegetables. Fruits All fresh, dried, or frozen fruit. Canned fruit in natural juice (without added sugar). Meat and other protein foods Skinless chicken or [place]. Ground chicken or [place]. Pork with fat trimmed off. Fish and seafood. Egg whites. Dried beans, peas, or lentils. Unsalted nuts, nut butters, and seeds. Unsalted canned beans. Lean cuts of beef with fat trimmed off. Low-sodium, lean deli meat. Dairy Low-fat (1%) or fat-free (skim) milk. Fat-free, low-fat, or reduced-fat cheeses. Nonfat, low-sodium ricotta or cottage cheese. Low-fat or nonfat yogurt. Low-fat, low-sodium cheese. Fats and oils Soft margarine without trans fats. Vegetable oil. Low-fat, reduced-fat, or light mayonnaise and salad dressings (reduced-sodium). Canola, safflower, olive, soybean, and sunflower oils. Avocado. Seasoning and other foods Herbs. Spices. Seasoning mixes without salt. Unsalted popcorn and pretzels. Fat-free sweets. What foods are not recommended? The items listed  may not be a complete list. Talk with your dietitian about what dietary choices are best for you. Grains Baked goods made with fat, such as croissants, muffins, or some breads. Dry pasta or rice meal packs. Vegetables Creamed or fried vegetables. Vegetables in a cheese sauce. Regular canned vegetables (not low-sodium or reduced-sodium). Regular canned tomato sauce and paste (not low-sodium or reduced-sodium). Regular tomato and vegetable juice (not low-sodium or reduced-sodium). Angie Fava. Olives. Fruits Canned fruit in a light or heavy syrup. Fried fruit. Fruit in cream or butter sauce. Meat and other protein foods Fatty cuts of meat. Ribs. Fried meat. Berniece Salines. Sausage. Bologna and other processed lunch meats. Salami. Fatback. Hotdogs. Bratwurst. Salted nuts and seeds. Canned beans with added salt. Canned or smoked fish. Whole eggs or egg yolks. Chicken or [place] with skin. Dairy Whole or 2% milk, cream, and half-and-half. Whole or full-fat cream cheese. Whole-fat or sweetened yogurt. Full-fat cheese. Nondairy creamers. Whipped toppings. Processed cheese and cheese spreads. Fats and oils Butter. Stick margarine. Lard. Shortening. Ghee. Bacon fat. Tropical oils, such as coconut, palm kernel, or palm oil. Seasoning and other foods Salted popcorn and pretzels. Onion salt, garlic salt, seasoned salt, table salt, and sea salt. Worcestershire sauce. Tartar sauce. Barbecue sauce. Teriyaki sauce. Soy sauce, including reduced-sodium.  Steak sauce. Canned and packaged gravies. Fish sauce. Oyster sauce. Cocktail sauce. Horseradish that you find on the shelf. Ketchup. Mustard. Meat flavorings and tenderizers. Bouillon cubes. Hot sauce and Tabasco sauce. Premade or packaged marinades. Premade or packaged taco seasonings. Relishes. Regular salad dressings. Where to find more information: National Heart, Lung, and [place]: https://wilson-eaton.com/ American Heart Association: www.heart.org Summary The DASH eating  plan is a healthy eating plan that has been shown to reduce high blood pressure (hypertension). It may also reduce your risk for type 2 diabetes, heart disease, and stroke. With the DASH eating plan, you should limit salt (sodium) intake to 2,300 mg a day. If you have hypertension, you may need to reduce your sodium intake to 1,500 mg a day. When on the DASH eating plan, aim to eat more fresh fruits and vegetables, whole grains, lean proteins, low-fat dairy, and heart-healthy fats. Work with your health care provider or diet and nutrition specialist (dietitian) to adjust your eating plan to your individual calorie needs. This information is not intended to replace advice given to you by your health care provider. Make sure you discuss any questions you have with your health care provider. Document Released: 02/28/2011 Document Revised: 02/21/2017 Document Reviewed: 03/04/2016 Elsevier Patient Education  2020 [employer].

## 2021-04-24 NOTE — Assessment & Plan Note (Signed)
He reports snoring and apnea.  Almost all of his siblings also have sleep apnea.  We will check a sleep study.

## 2021-04-24 NOTE — Assessment & Plan Note (Signed)
Jackson Alexander' BP is poorly controlled on >4 medications.  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  We did discuss some lifestyle changes that he can work on.  He is going to work on trying to increase his exercise to at least 150 minutes weekly.  We will refer him to the PREP program to the [facility] so that he can find work with the group and increase his accountability.  He is already having a healthy diet as his husband cooks meals and limit sodium intake.  I am suspicious that he has sleep apnea given his snoring history and the fact that almost all of his siblings have sleep apnea.  We will refer him for sleep study.  He is struggling with fatigue on clonidine.  For now his blood pressures are so poorly controlled that we cannot stop it.  However that will be our goal for the future.  We will add spironolactone 25 mg daily which should also help with his occasional lower extremity edema.  Switch losartan/HCTZ to valsartan/HCTZ 320/25 mg daily.  We will also switch metoprolol to carvedilol for improved blood pressure control.  Start with 25 mg twice daily.  He will check his blood pressures twice daily.  He does consent to remote monitoring in our vivify remote patient monitoring program and would like to participate in our research study.  He is going to work on limiting his caffeine to no more than 1 to 2 cups of coffee daily.

## 2021-04-24 NOTE — Research (Signed)
Subject Name: Jackson Alexander met inclusion and exclusion criteria for the Virtual Care and Social Determinant Interventions for the management of hypertension trial.  The informed consent form, study requirements and expectations were reviewed with the subject by Dr. Oval Linsey and myself. The subject was given the opportunity to read the consent and ask questions. The subject verbalized understanding of the trial requirements.  All questions were addressed prior to the signing of the consent form. The subject agreed to participate in the trial and signed the informed consent. The informed consent was obtained prior to performance of any protocol-specific procedures for the subject.  A copy of the signed informed consent was given to the subject and a copy was placed in the subject's medical record.  Jackson Alexander was randomized to Group 2.

## 2021-04-24 NOTE — Progress Notes (Signed)
Advanced Hypertension Clinic Initial Assessment:    Date:  04/24/2021   ID:  Ralphael, [place] Oct 12, 1955, MRN [id number]  PCP:  [name], Rayford Halsted, MD  Cardiologist:  None  Nephrologist:  Referring MD: [name], Estel*   CC: Hypertension  History of Present Illness:    Jackson Alexander is a 66 y.o. male with a hx of hypertension, chronic venous insufficiency, and obesity, here to establish care in the Advanced Hypertension Clinic. He saw Dr. [name] 03/13/2021 and his blood pressure was 146/80 on a regimen of 100 mg losartan daily, 25 mg HCTZ daily, 100 mg metoprolol twice daily, and 75 mg hydralazine 3 times daily. On 01/16/21 0.1 mg clonidine twice daily had also been added. Due to his blood pressure remaining uncontrolled overnight despite compliance with 5 antihypertensives, he was referred to the Advanced Hypertension Clinic.  Today, he appears well overall. He does not recall when he was first diagnosed with hypertension, but it has been at least several years. His blood pressure has gradually been more difficult to control. At home he has not been monitoring his blood pressure. Intermittently over years he has struggled with LE edema. It was worse when he was previously on amlodipine. After taking clonidine he becomes significantly drowsy. He does not participate in much formal exercise. 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 Generally he feels well while walking. He drinks 2-3 cups of coffee in the morning. Alcohol consumption is limited to once a week at most. His partner cooks meals at home and they try to follow a healthy diet. He has been told that he snores, and generally feels fatigued when he wakes up. Typically he is sleeping 4-5 hours a night. He states this low amount of sleep is baseline for him. He denies any palpitations, chest pain, or shortness of breath. No lightheadedness, headaches, syncope,  orthopnea, or PND.  Previous antihypertensives: Amlodipine - peripheral edema  Past Medical History:  Diagnosis Date   Chronic venous insufficiency    Hx of adenomatous polyp of colon 11/14/2019   10/2019 diminutive adenoma recall 2028   Hypertension    Obesity (BMI 30-39.9)    Snoring 04/24/2021    Past Surgical History:  Procedure Laterality Date   COLONOSCOPY  07/26/2008   Dr.Orr-hyperplastic  polyps    Current Medications: Current Meds  Medication Sig   carvedilol (COREG) 25 MG tablet Take 1 tablet (25 mg total) by mouth 2 (two) times daily.   cloNIDine (CATAPRES) 0.1 MG tablet Take 1 tablet (0.1 mg total) by mouth 2 (two) times daily.   fexofenadine (ALLEGRA) 180 MG tablet Take 180 mg by mouth daily.   hydrALAZINE (APRESOLINE) 50 MG tablet Take 1.5 tablets (75 mg total) by mouth 3 (three) times daily.   OVER THE COUNTER MEDICATION Vitamin D3 50 mcg 2,000 iu per day   sodium chloride (OCEAN) 0.65 % SOLN nasal spray Place 2 sprays into both nostrils every 2 (two) hours while awake.   spironolactone (ALDACTONE) 25 MG tablet Take 1 tablet (25 mg total) by mouth daily.   valsartan-hydrochlorothiazide (DIOVAN-HCT) 320-25 MG tablet Take 1 tablet by mouth daily.   [DISCONTINUED] losartan-hydrochlorothiazide (HYZAAR) 100-25 MG tablet TAKE 1 TABLET BY MOUTH EVERY DAY   [DISCONTINUED] metoprolol tartrate (LOPRESSOR) 100 MG tablet TAKE 1 TABLET BY MOUTH TWICE A DAY     Allergies:   Patient has no known allergies.   Social History   Socioeconomic  History   Marital status: Married    Spouse name: Not on file   Number of children: Not on file   Years of education: Not on file   Highest education level: Some college, no degree  Occupational History   Not on file  Tobacco Use   Smoking status: Never   Smokeless tobacco: Never  Vaping Use   Vaping Use: Never used  Substance and Sexual Activity   Alcohol use: Yes    Alcohol/week: 1.0 standard drink    Types: 1 Glasses of wine  per week   Drug use: Never   Sexual activity: Not on file  Other Topics Concern   Not on file  Social History Narrative   Not on file   Social Determinants of Health   Financial Resource Strain: Low Risk    Difficulty of Paying Living Expenses: Not hard at all  Food Insecurity: No Food Insecurity   Worried About Charity fundraiser in the Last Year: Never true   [place] in the Last Year: Never true  Transportation Needs: No Transportation Needs   Lack of Transportation (Medical): No   Lack of Transportation (Non-Medical): No  Physical Activity: Insufficiently Active   Days of Exercise per Week: 4 days   Minutes of Exercise per Session: 20 min  Stress: No Stress Concern Present   Feeling of Stress : Not at all  Social Connections: Unknown   Frequency of Communication with Friends and Family: More than three times a week   Frequency of Social Gatherings with Friends and Family: Once a week   Attends Religious Services: Patient refused   Marine scientist or Organizations: No   Attends Music therapist: Not on file   Marital Status: Married     Family History: The patient's family history includes Breast cancer in his maternal aunt, maternal grandmother, sister, sister, and sister; Colon polyps in his brother and sister; Heart failure in his brother; Lung cancer in his father; Melanoma in his brother; Prostate cancer in his maternal grandfather; Renal cancer in his brother and maternal uncle; Sleep apnea in his brother, sister, and sister; Stomach cancer in his mother. There is no history of Colon cancer, Esophageal cancer, or Rectal cancer.  ROS:   Please see the history of present illness.    (+) Intermittent LE edema (+) Snoring All other systems reviewed and are negative.  EKGs/Labs/Other Studies Reviewed:    CTA Chest 05/21/2004: Findings: the lungs are clear. No pleural or pericardial fluid. No mediastinal or hilar adenopathy. The aorta has a  normal appearance without evidence of dissection. No atherosclerotic change or aneurysm. No abnormality of the pulmonary arterial tree. Scans in the upper abdomen do not show any pathologic finding.  IMPRESSION:    Negative CT angiogram of the chest. No evidence of aortic pathology. See above for full report.   EKG:   04/24/2021: Sinus rhythm. Rate 74 bpm.  Recent Labs: 08/14/2020: ALT 25; BUN 13; Creatinine, Ser 0.94; Hemoglobin 16.3; Platelets 178; Potassium 3.7; Sodium 137; TSH 1.640   Recent Lipid Panel    Component Value Date/Time   CHOL 149 08/14/2020 0912   TRIG 97 08/14/2020 0912   HDL 42 08/14/2020 0912   CHOLHDL 3.5 08/14/2020 0912   CHOLHDL 3.9 10/06/2019 0833   LDLCALC 89 08/14/2020 0912   LDLCALC 92 10/06/2019 0833    Physical Exam:    VS:  BP (!) 176/96 (BP Location: Right Arm, Patient Position: Sitting,  Cuff Size: Normal)    Pulse 74    Ht 5\' 9"  (1.753 m)    Wt 253 lb (114.8 kg)    BMI 37.36 kg/m  , BMI Body mass index is 37.36 kg/m. GENERAL:  Well appearing HEENT: Pupils equal round and reactive, fundi not visualized, oral mucosa unremarkable NECK:  No jugular venous distention, waveform within normal limits, carotid upstroke brisk and symmetric, no bruits, no thyromegaly LUNGS:  Clear to auscultation bilaterally HEART:  RRR.  PMI not displaced or sustained,S1 and S2 within normal limits, no S3, no S4, no clicks, no rubs, no murmurs ABD:  Flat, positive bowel sounds normal in frequency in pitch, no bruits, no rebound, no guarding, no midline pulsatile mass, no hepatomegaly, no splenomegaly EXT:  2 plus pulses throughout, no edema, no cyanosis no clubbing SKIN:  No rashes no nodules NEURO:  Cranial nerves II through XII grossly intact, motor grossly intact throughout PSYCH:  Cognitively intact, oriented to person place and time   ASSESSMENT/PLAN:    Resistant hypertension Jackson Alexander' BP is poorly controlled on >4 medications.  Therefore we would consider him to  have resistant hypertension.  We did discuss some lifestyle changes that he can work on.  He is going to work on trying to increase his exercise to at least 150 minutes weekly.  We will refer him to the PREP program to the [facility] so that he can find work with the group and increase his accountability.  He is already having a healthy diet as his husband cooks meals and limit sodium intake.  I am suspicious that he has sleep apnea given his snoring history and the fact that almost all of his siblings have sleep apnea.  We will refer him for sleep study.  He is struggling with fatigue on clonidine.  For now his blood pressures are so poorly controlled that we cannot stop it.  However that will be our goal for the future.  We will add spironolactone 25 mg daily which should also help with his occasional lower extremity edema.  Switch losartan/HCTZ to valsartan/HCTZ 320/25 mg daily.  We will also switch metoprolol to carvedilol for improved blood pressure control.  Start with 25 mg twice daily.  He will check his blood pressures twice daily.  He does consent to remote monitoring in our vivify remote patient monitoring program and would like to participate in our research study.  He is going to work on limiting his caffeine to no more than 1 to 2 cups of coffee daily.  Obesity (BMI 30-39.9) Referral to PREP.  Increase exercise to at least 150 minutes weekly.  Snoring He reports snoring and apnea.  Almost all of his siblings also have sleep apnea.  We will check a sleep study.   Screening for Secondary Hypertension: 65 Causes 04/24/2021  Drugs/Herbals Screened     - Comments 2-3 cups of coffee daily, occasional EtOH.  Limits salt.  Sleep Apnea Screened     - Comments snores, daytime somnolence    Relevant Labs/Studies: Basic Labs Latest Ref Rng & Units 08/14/2020 05/24/2020 10/06/2019  Sodium 134 - 144 mmol/L 137 135 139  Potassium 3.5 - 5.2 mmol/L 3.7 4.0 3.6  Creatinine 0.76 - 1.27 mg/dL 0.94 1.12 1.10     Thyroid  Latest Ref Rng & Units 08/14/2020 10/06/2019  TSH 0.450 - 4.500 uIU/mL 1.640 1.27     Disposition:    FU with APP/PharmD in 1 month.   FU with Lashawnda Hancox C. Oval Linsey,  MD, [facility] in 4 months.   Medication Adjustments/Labs and Tests Ordered: Current medicines are reviewed at length with the patient today.  Concerns regarding medicines are outlined above.   Orders Placed This Encounter  Procedures   Basic metabolic panel   Cantrils Ladder Assessment   EKG 12-Lead   Meds ordered this encounter  Medications   valsartan-hydrochlorothiazide (DIOVAN-HCT) 320-25 MG tablet    Sig: Take 1 tablet by mouth daily.    Dispense:  90 tablet    Refill:  3    D/C LOSARTAN   carvedilol (COREG) 25 MG tablet    Sig: Take 1 tablet (25 mg total) by mouth 2 (two) times daily.    Dispense:  180 tablet    Refill:  3    D/C METOPROLOL   spironolactone (ALDACTONE) 25 MG tablet    Sig: Take 1 tablet (25 mg total) by mouth daily.    Dispense:  90 tablet    Refill:  3   I,Mathew Stumpf,acting as a scribe for Skeet Latch, MD.,have documented all relevant documentation on the behalf of Skeet Latch, MD,as directed by  Skeet Latch, MD while in the presence of Skeet Latch, MD.  I, Tajique Oval Linsey, MD have reviewed all documentation for this visit.  The documentation of the exam, diagnosis, procedures, and orders on 04/24/2021 are all accurate and complete.  Time spent: 65 minutes-Greater than 50% of this time was spent in counseling, explanation of diagnosis, planning of further management, and coordination of care.   Signed, Skeet Latch, MD  04/24/2021 5:48 PM    [place]

## 2021-04-26 ENCOUNTER — Telehealth: Payer: Self-pay

## 2021-04-26 DIAGNOSIS — Z0000 Encounter for general adult medical examination without abnormal findings: Secondary | ICD-10-CM

## 2021-04-26 NOTE — Telephone Encounter (Signed)
Called patient for [employer] 24-hour follow up call and to offer health coaching to determine if the patient is interested in increasing his physical activity and improving healthy eating behaviors per his survey responses in [place].  Patient did not answer. Left a message for patient to return call to discuss Vivify and health coaching in detail.   Jorgia Manthei Truman Hayward, [facility] Guide, Health Coach [address], [address] [place] [address] Telephone: [phone] Email: [email or web address]

## 2021-05-01 ENCOUNTER — Other Ambulatory Visit: Payer: Self-pay | Admitting: *Deleted

## 2021-05-01 ENCOUNTER — Other Ambulatory Visit: Payer: Self-pay

## 2021-05-01 DIAGNOSIS — I10 Essential (primary) hypertension: Secondary | ICD-10-CM

## 2021-05-01 NOTE — Progress Notes (Signed)
Lab per Cards. Order modified to print in clinic.

## 2021-05-02 ENCOUNTER — Telehealth: Payer: Self-pay

## 2021-05-02 DIAGNOSIS — Z0000 Encounter for general adult medical examination without abnormal findings: Secondary | ICD-10-CM

## 2021-05-02 LAB — BASIC METABOLIC PANEL
BUN/Creatinine Ratio: 17 (ref 10–24)
BUN: 20 mg/dL (ref 8–27)
CO2: 24 mmol/L (ref 20–29)
Calcium: 10 mg/dL (ref 8.6–10.2)
Chloride: 98 mmol/L (ref 96–106)
Creatinine, Ser: 1.17 mg/dL (ref 0.76–1.27)
Glucose: 94 mg/dL (ref 70–99)
Potassium: 4.7 mmol/L (ref 3.5–5.2)
Sodium: 139 mmol/L (ref 134–144)
eGFR: 69 mL/min/{1.73_m2} (ref >59)

## 2021-05-02 NOTE — Telephone Encounter (Signed)
Called patient for vivify f/u call and to offer health coaching to increase physical activity per his survey response. Patient did not answer. Left a message for patient to return call to discuss any concerns with Vivify and interest in health coaching.    Sharlot Sturkey Truman Hayward, [facility] Guide, Health Coach [address] [place] [address] Telephone: [phone] Email: [email or web address]

## 2021-05-03 ENCOUNTER — Other Ambulatory Visit: Payer: Self-pay

## 2021-05-03 ENCOUNTER — Ambulatory Visit: Payer: Self-pay | Admitting: *Deleted

## 2021-05-03 VITALS — BP 118/74

## 2021-05-03 DIAGNOSIS — I10 Essential (primary) hypertension: Secondary | ICD-10-CM

## 2021-05-03 NOTE — Progress Notes (Signed)
Routine BP check per pt request. New meds started one week ago. See Cards OV notes. Pt considering going back to old routine as he is somewhat dizzy today and past few days. BP at home today 110/70. In office 118/74. Pt concerned he was checking it incorrectly due to the sharp decrease from the day before. Advised pt that we can adjust his dosing schedule temporarily to give his body time to adjust to lower BP. Advised him to consider when he is most dizzy through the day and drop the prior hydralazine dose as it is TID. Then recheck in a week and if feeling better but still uncontrolled, add the third dose back in to his routine. Pt agreeable with this plan and will monitor at home and plan to RTC in one week for recheck.

## 2021-05-08 ENCOUNTER — Encounter (HOSPITAL_BASED_OUTPATIENT_CLINIC_OR_DEPARTMENT_OTHER): Payer: Self-pay

## 2021-05-08 ENCOUNTER — Telehealth (HOSPITAL_BASED_OUTPATIENT_CLINIC_OR_DEPARTMENT_OTHER): Payer: Self-pay | Admitting: *Deleted

## 2021-05-08 ENCOUNTER — Telehealth: Payer: Self-pay | Admitting: Cardiovascular Disease

## 2021-05-08 DIAGNOSIS — I10 Essential (primary) hypertension: Secondary | ICD-10-CM

## 2021-05-08 MED ORDER — HYDRALAZINE HCL 50 MG PO TABS: 50.0000 mg | ORAL_TABLET | Freq: Three times a day (TID) | ORAL | 3 refills | Status: DC

## 2021-05-08 MED ORDER — VALSARTAN-HYDROCHLOROTHIAZIDE 160-12.5 MG PO TABS: 1.0000 | ORAL_TABLET | Freq: Every day | ORAL | 3 refills | Status: DC

## 2021-05-08 MED ORDER — CARVEDILOL 12.5 MG PO TABS: 12.5000 mg | ORAL_TABLET | Freq: Two times a day (BID) | ORAL | 3 refills | Status: DC

## 2021-05-08 NOTE — Telephone Encounter (Signed)
Alerted by monitoring team that Mr. Ochs's BP is running too low and he is dizzy.  We will make the following changes: Reduce hydralazine to 50mg  tid, coreg 12.5mg  bid and 1/2 of his valsartan/HCTZ. For today and tomorrow he will break his clonidine in half and then stop it all together on Thursday  Shevy Yaney C. Oval Linsey, MD, [facility] 05/08/2021 4:31 PM

## 2021-05-08 NOTE — Telephone Encounter (Signed)
New Rx's sent to pharmacy and mailed updated medication list  Sent via mychart as requested by patient when I spoke to him

## 2021-05-08 NOTE — Addendum Note (Signed)
Addended by: Alvina Filbert B on: 05/08/2021 05:36 PM   Modules accepted: Orders, Level of Service

## 2021-05-08 NOTE — Telephone Encounter (Signed)
Jackson Alexander I called this patient.  BP running too low.  Can you please make the following med changes and  then send him updated med lis?  Reduce hydralazine to 50mg  tid, coreg 12.5mg  bid and 1/2 of his valsartan/HCTZ.  For today and tomorrow he will break his clonidine in half and then stop it all together on Thursday  Above message received from Dr Oval Linsey  New Rx's sent to pharmacy and mailed updated medication list  Sent via mychart as requested by patient when I spoke to him

## 2021-05-08 NOTE — Telephone Encounter (Signed)
This encounter was created in error - please disregard.

## 2021-05-15 ENCOUNTER — Telehealth: Payer: Self-pay

## 2021-05-15 DIAGNOSIS — Z0000 Encounter for general adult medical examination without abnormal findings: Secondary | ICD-10-CM

## 2021-05-15 NOTE — Telephone Encounter (Signed)
Called patient to offer health coaching per [place] responses to work towards increasing his physical activity. Patient did not answer. Left message for patient to return call to discuss health coaching more in detail.    Laurieann Friddle Truman Hayward, [facility] Guide, Health Coach [address], [address] [place] [address] Telephone: [phone] Email: [email or web address]

## 2021-05-24 DIAGNOSIS — I10 Essential (primary) hypertension: Secondary | ICD-10-CM | POA: Diagnosis not present

## 2021-06-23 DIAGNOSIS — I10 Essential (primary) hypertension: Secondary | ICD-10-CM | POA: Diagnosis not present

## 2021-06-27 ENCOUNTER — Ambulatory Visit: Payer: No Typology Code available for payment source | Admitting: Pharmacist

## 2021-06-27 ENCOUNTER — Encounter: Payer: Self-pay | Admitting: Pharmacist

## 2021-06-27 VITALS — BP 122/75 | HR 60 | Wt 251.0 lb

## 2021-06-27 DIAGNOSIS — I10 Essential (primary) hypertension: Secondary | ICD-10-CM

## 2021-06-27 NOTE — Progress Notes (Signed)
Patient ID: Jackson Alexander                 DOB: November 24, 1955                      MRN: [id number] ? ? ? ? ?HPI: ?Jackson Alexander is a 66 y.o. male referred by Dr. Oval Linsey to HTN clinic. PMH is significant for resistant HTN and obesity.  Referred to Dr Oval Linsey from PCP.  At first visit with Dr Oval Linsey, patient was started on spironolactone and metoprolol was switched to carvedilol and Losartan/HCTZ was switched to valsartan/HCTZ. ? ?Patient called back a few weeks later with hypotensive symptoms and medications were reduced. ? ?Patient presents today in good spirits.  Has been diligently checking BP at home and submitting through [facility]. Reports no longer having hypotensive symptoms.   ? ?Has increased physical activity in last month. Now uses the treadmill approximately twice a week for 30-40 minutes. Has been eating more fruits and vegetables.  Reports he has never eaten much salt. ? ?Job stress has lessened as well. Announced he will be retiring in July. ? ?Current HTN meds:  ?Valsartan/HCTZ 160/12.'5mg'$  daily ?Hydralazine '50mg'$  TID ?Carvedilol 12.'5mg'$  BID ?Spironolactone '25mg'$  daily ? ?Previously tried: Clonidine ? ?BP goal: <130/80 ? ?Home BP readings:  ? ?113/76, 124/80, 126/82, 128/88, 121/77, 107/63 ? ?Wt Readings from Last 3 Encounters:  ?04/24/21 253 lb (114.8 kg)  ?03/13/21 244 lb 14.4 oz (111.1 kg)  ?01/16/21 246 lb 8 oz (111.8 kg)  ? ?BP Readings from Last 3 Encounters:  ?05/03/21 118/74  ?04/24/21 (!) 176/96  ?04/10/21 (!) 151/96  ? ?Pulse Readings from Last 3 Encounters:  ?04/24/21 74  ?04/10/21 60  ?03/13/21 60  ? ? ?Renal function: ?CrCl cannot be calculated (Patient's most recent lab result is older than the maximum 21 days allowed.). ? ?Past Medical History:  ?Diagnosis Date  ? Chronic venous insufficiency   ? Hx of adenomatous polyp of colon 11/14/2019  ? 10/2019 diminutive adenoma recall 2028  ? Hypertension   ? Obesity (BMI 30-39.9)   ? Snoring 04/24/2021  ? ? ?Current Outpatient Medications on  File Prior to Visit  ?Medication Sig Dispense Refill  ? carvedilol (COREG) 12.5 MG tablet Take 1 tablet (12.5 mg total) by mouth 2 (two) times daily. 180 tablet 3  ? fexofenadine (ALLEGRA) 180 MG tablet Take 180 mg by mouth daily.    ? hydrALAZINE (APRESOLINE) 50 MG tablet Take 1 tablet (50 mg total) by mouth 3 (three) times daily. 270 tablet 3  ? OVER THE COUNTER MEDICATION Vitamin D3 ?50 mcg 2,000 iu per day    ? sodium chloride (OCEAN) 0.65 % SOLN nasal spray Place 2 sprays into both nostrils every 2 (two) hours while awake.  0  ? spironolactone (ALDACTONE) 25 MG tablet Take 1 tablet (25 mg total) by mouth daily. 90 tablet 3  ? valsartan-hydrochlorothiazide (DIOVAN HCT) 160-12.5 MG tablet Take 1 tablet by mouth daily. 90 tablet 3  ? ?No current facility-administered medications on file prior to visit.  ? ? ?No Known Allergies ? ? ?Assessment/Plan: ? ?1. Hypertension -  Patient BP in room today 122/75 which is at goal of <130/80 and patient is tolerating medications well.  No longer having symptoms of hypotension and majority of home readings are at or very near goal.  No med changes needed at this time. ? ?Recommended patient increase exercise up to 30 minutes a day at least 5 days a week.  Follow up with Dr Oval Linsey already scheduled for 2 months. ? ?Continue: ?Valsartan/HCTZ 160/12.'5mg'$  daily ?Hydralazine '50mg'$  TID ?Carvedilol 12.'5mg'$  BID ?Spironolactone '25mg'$  daily ?Recheck with Dr Oval Linsey in 2 months ? ?Karren Cobble, PharmD, BCACP, [place], CPP ?[address], [address] ?[place], [place], [address] ?Phone: [phone], Fax: [phone]  ?

## 2021-06-27 NOTE — Patient Instructions (Addendum)
It was nice meeting you today! ? ?We would like your blood pressure to stay less than 130/80 ? ?Please continue your: ? ?Valsartan/Hydrochlorothiazide 160/12.'5mg'$  daily ?Hydralazine '50mg'$  three times a day ?Carvedilol 12.'5mg'$  twice a day ?Spironolactone '25mg'$  daily ? ?Please try to increase your physical activity up to at least 30 minutes a day at least 5 days a week ? ?Please call with any questions! ? ?Karren Cobble, PharmD, BCACP, [place], CPP ?[address], [address] ?[place], [place], [address] ?Phone: [phone], Fax: [phone]  ?

## 2021-07-23 DIAGNOSIS — I10 Essential (primary) hypertension: Secondary | ICD-10-CM | POA: Diagnosis not present

## 2021-08-31 ENCOUNTER — Telehealth: Payer: Self-pay

## 2021-08-31 DIAGNOSIS — Z0000 Encounter for general adult medical examination without abnormal findings: Secondary | ICD-10-CM

## 2021-08-31 NOTE — Telephone Encounter (Signed)
Called patient to inform him to bring his Vivify cuff/device to return during his next visit with Dr. Oval Linsey on 6/13. Patient did not answer. Left message regarding device return.  Havoc Sanluis Truman Hayward, [facility] Guide, Health Coach [address], [address] [place] [address] Telephone: [phone] Email: [email or web address]

## 2021-09-03 ENCOUNTER — Ambulatory Visit (HOSPITAL_BASED_OUTPATIENT_CLINIC_OR_DEPARTMENT_OTHER): Payer: No Typology Code available for payment source | Admitting: Cardiovascular Disease

## 2021-09-03 NOTE — Progress Notes (Signed)
Advanced Hypertension Clinic Follow-up:    Date:  09/04/2021   ID:  Kiing, Deakin [birth year], MRN [id number]  PCP:  [name], Rayford Halsted, MD  Cardiologist:  None  Nephrologist:  Referring MD: [name], [place]*   CC: Hypertension  History of Present Illness:    Jackson Alexander is a 66 y.o. male with a hx of hypertension, chronic venous insufficiency, and obesity, here for follow-up.  He was initially seen 04/24/2021 in the Advanced Hypertension Clinic. He saw Dr. [name] 03/13/2021 and his blood pressure was 146/80 on a regimen of 100 mg losartan daily, 25 mg HCTZ daily, 100 mg metoprolol twice daily, and 75 mg hydralazine 3 times daily. On 01/16/21 0.1 mg clonidine twice daily had also been added. Due to his blood pressure remaining uncontrolled overnight despite compliance with 5 antihypertensives, he was referred to the Advanced Hypertension Clinic.  At his last appointment, he confirmed struggling with hypertension for at least several years. He had intermittent LE edema for years, worse while on amlodipine previously. He also reported drowsiness after taking clonidine. He was drinking 2-3 cups of coffee and only sleeping 4-5 hours a night. Spironolactone 25 mg daily was added. Losartan/HCTZ was switched to valsartan/HCTZ  320/25 mg daily. Metoprolol was switched to carvedilol 25 mg BID. He was enrolled in our RPM study through [employer]. He was referred to PREP and referred for a sleep study. He followed up with our pharmacist 06/27/2021 and his BP was 122/75 with no recurrent hypotensive symptoms. Lately his blood pressures have been running in the 100s-120s/60s-70s.  Today, he reports feeling well aside from noticing his weight gain. His weight is up about 3 lbs since his last appointment, which he would attribute to eating ice cream every night. He is participating in some formal exercise, including walking and using the treadmill. He denies feeling any  lightheadedness with recent lower blood pressures. He continues to have stable amounts of LE edema, which is generally not concerning for him. At the end of the month he plans to retire, and by the end of September he will be moving to [place]. He denies any palpitations, chest pain, or shortness of breath. No headaches, syncope, orthopnea, or PND.  Previous antihypertensives: Amlodipine - peripheral edema  Past Medical History:  Diagnosis Date   Chronic venous insufficiency    Hx of adenomatous polyp of colon 11/14/2019   10/2019 diminutive adenoma recall 2028   Hypertension    Obesity (BMI 30-39.9)    Snoring 04/24/2021    Past Surgical History:  Procedure Laterality Date   COLONOSCOPY  07/26/2008   Dr.Orr-hyperplastic  polyps    Current Medications: Current Meds  Medication Sig   carvedilol (COREG) 12.5 MG tablet Take 1 tablet (12.5 mg total) by mouth 2 (two) times daily.   fexofenadine (ALLEGRA) 180 MG tablet Take 180 mg by mouth daily.   hydrALAZINE (APRESOLINE) 50 MG tablet Take 1 tablet (50 mg total) by mouth 3 (three) times daily.   OVER THE COUNTER MEDICATION Vitamin D3 50 mcg 2,000 iu per day   sodium chloride (OCEAN) 0.65 % SOLN nasal spray Place 2 sprays into both nostrils every 2 (two) hours while awake.   spironolactone (ALDACTONE) 25 MG tablet Take 1 tablet (25 mg total) by mouth daily.   valsartan-hydrochlorothiazide (DIOVAN HCT) 160-12.5 MG tablet Take 1 tablet by mouth daily.     Allergies:   Amlodipine besylate   Social History   Socioeconomic History   Marital  status: Married    Spouse name: Not on file   Number of children: Not on file   Years of education: Not on file   Highest education level: Some college, no degree  Occupational History   Not on file  Tobacco Use   Smoking status: Never   Smokeless tobacco: Never  Vaping Use   Vaping Use: Never used  Substance and Sexual Activity   Alcohol use: Yes    Alcohol/week: 1.0 standard drink of  alcohol    Types: 1 Glasses of wine per week   Drug use: Never   Sexual activity: Not on file  Other Topics Concern   Not on file  Social History Narrative   Not on file   Social Determinants of Health   Financial Resource Strain: Low Risk  (03/11/2021)   Overall Financial Resource Strain (CARDIA)    Difficulty of Paying Living Expenses: Not hard at all  Food Insecurity: No Food Insecurity (03/11/2021)   Hunger Vital Sign    Worried About Running Out of Food in the Last Year: Never true    Ran Out of Food in the Last Year: Never true  Transportation Needs: No Transportation Needs (03/11/2021)   PRAPARE - Hydrologist (Medical): No    Lack of Transportation (Non-Medical): No  Physical Activity: Insufficiently Active (03/11/2021)   Exercise Vital Sign    Days of Exercise per Week: 4 days    Minutes of Exercise per Session: 20 min  Stress: No Stress Concern Present (03/11/2021)   [place]    Feeling of Stress : Not at all  Social Connections: Unknown (03/11/2021)   Social Connection and Isolation Panel [NHANES]    Frequency of Communication with Friends and Family: More than three times a week    Frequency of Social Gatherings with Friends and Family: Once a week    Attends Religious Services: Patient refused    Marine scientist or Organizations: No    Attends Music therapist: Not on file    Marital Status: Married     Family History: The patient's family history includes Breast cancer in his maternal aunt, maternal grandmother, sister, sister, and sister; Colon polyps in his brother and sister; Heart failure in his brother; Lung cancer in his father; Melanoma in his brother; Prostate cancer in his maternal grandfather; Renal cancer in his brother and maternal uncle; Sleep apnea in his brother, sister, and sister; Stomach cancer in his mother. There is no history of  Colon cancer, Esophageal cancer, or Rectal cancer.  ROS:   Please see the history of present illness.    (+) Stable LE edema All other systems reviewed and are negative.  EKGs/Labs/Other Studies Reviewed:    CTA Chest  05/21/2004: Findings: the lungs are clear. No pleural or pericardial fluid. No mediastinal or hilar adenopathy. The aorta has a normal appearance without evidence of dissection. No atherosclerotic change or aneurysm. No abnormality of the pulmonary arterial tree. Scans in the upper abdomen do not show any pathologic finding.  IMPRESSION:    Negative CT angiogram of the chest. No evidence of aortic pathology. See above for full report.   EKG:  EKG is personally reviewed. 09/04/2021:  EKG was not ordered. 04/24/2021: Sinus rhythm. Rate 74 bpm.  Recent Labs: 05/01/2021: BUN 20; Creatinine, Ser 1.17; Potassium 4.7; Sodium 139   Recent Lipid Panel    Component Value Date/Time   CHOL  149 08/14/2020 0912   TRIG 97 08/14/2020 0912   HDL 42 08/14/2020 0912   CHOLHDL 3.5 08/14/2020 0912   CHOLHDL 3.9 10/06/2019 0833   LDLCALC 89 08/14/2020 0912   LDLCALC 92 10/06/2019 0833    Physical Exam:    VS:  BP 120/74 (BP Location: Right Arm, Patient Position: Sitting, Cuff Size: Large)   Pulse 81   Ht '5\' 9"'$  (1.753 m)   Wt 254 lb 8 oz (115.4 kg)   SpO2 94%   BMI 37.58 kg/m  , BMI Body mass index is 37.58 kg/m. GENERAL:  Well appearing HEENT: Pupils equal round and reactive, fundi not visualized, oral mucosa unremarkable NECK:  No jugular venous distention, waveform within normal limits, carotid upstroke brisk and symmetric, no bruits, no thyromegaly LUNGS:  Clear to auscultation bilaterally HEART:  RRR.  PMI not displaced or sustained,S1 and S2 within normal limits, no S3, no S4, no clicks, no rubs, no murmurs ABD:  Flat, positive bowel sounds normal in frequency in pitch, no bruits, no rebound, no guarding, no midline pulsatile mass, no hepatomegaly, no splenomegaly EXT:  2  plus pulses throughout, no edema, no cyanosis no clubbing SKIN:  No rashes no nodules NEURO:  Cranial nerves II through XII grossly intact, motor grossly intact throughout PSYCH:  Cognitively intact, oriented to person place and time   ASSESSMENT/PLAN:    Resistant hypertension Blood pressures have been very well controlled lately on his regimen of carvedilol, hydralazine, spironolactone, and valsartan/HCTZ.  He was encouraged to increase his exercise to at least 150 minutes weekly and to start a new exercise routine given that he is about to retire.  We also discussed the importance of watching his diet and limiting ice cream intake.  He has completed the remote patient monitoring study.  Obesity (BMI 30-39.9) Working on diet and exercise as above.  Screening for Secondary Hypertension: 65    04/24/2021    4:31 PM  Causes  Drugs/Herbals Screened     - Comments 2-3 cups of coffee daily, occasional EtOH.  Limits salt.  Sleep Apnea Screened     - Comments snores, daytime somnolence    Relevant Labs/Studies:    Latest Ref Rng & Units 05/01/2021   10:44 AM 08/14/2020    9:12 AM 05/24/2020    9:04 AM  Basic Labs  Sodium 134 - 144 mmol/L 139  137  135   Potassium 3.5 - 5.2 mmol/L 4.7  3.7  4.0   Creatinine 0.76 - 1.27 mg/dL 1.17  0.94  1.12        Latest Ref Rng & Units 08/14/2020    9:12 AM 10/06/2019    8:33 AM  Thyroid   TSH 0.450 - 4.500 uIU/mL 1.640  1.27      Disposition:    FU with Ashlea Dusing C. Oval Linsey, MD, [facility] in 6 months.   Medication Adjustments/Labs and Tests Ordered: Current medicines are reviewed at length with the patient today.  Concerns regarding medicines are outlined above.   No orders of the defined types were placed in this encounter.  No orders of the defined types were placed in this encounter.  I,Mathew Stumpf,acting as a Education administrator for Skeet Latch, MD.,have documented all relevant documentation on the behalf of Skeet Latch, MD,as directed by   Skeet Latch, MD while in the presence of Skeet Latch, MD.  I, [place] Oval Linsey, MD have reviewed all documentation for this visit.  The documentation of the exam, diagnosis, procedures, and orders on  09/04/2021 are all accurate and complete.  Signed, Skeet Latch, MD  09/04/2021 5:17 PM    [place]

## 2021-09-04 ENCOUNTER — Ambulatory Visit (HOSPITAL_BASED_OUTPATIENT_CLINIC_OR_DEPARTMENT_OTHER): Payer: No Typology Code available for payment source | Admitting: Cardiovascular Disease

## 2021-09-04 ENCOUNTER — Encounter (HOSPITAL_BASED_OUTPATIENT_CLINIC_OR_DEPARTMENT_OTHER): Payer: Self-pay | Admitting: Cardiovascular Disease

## 2021-09-04 DIAGNOSIS — E669 Obesity, unspecified: Secondary | ICD-10-CM | POA: Diagnosis not present

## 2021-09-04 DIAGNOSIS — I10 Essential (primary) hypertension: Secondary | ICD-10-CM

## 2021-09-04 DIAGNOSIS — Z006 Encounter for examination for normal comparison and control in clinical research program: Secondary | ICD-10-CM

## 2021-09-04 NOTE — Research (Signed)
I saw pt today after Dr. Blenda Mounts follow up visit. Pt is in Dr. Blenda Mounts Virtual Care HTN Study. Pt filled out research survey. Pt was enrolled in Group 2. Pt has successfully reached his target blood pressure. Pt has successfully completed the  Virtual Care HTN Study.

## 2021-09-04 NOTE — Assessment & Plan Note (Addendum)
Blood pressures have been very well controlled lately on his regimen of carvedilol, hydralazine, spironolactone, and valsartan/HCTZ.  He was encouraged to increase his exercise to at least 150 minutes weekly and to start a new exercise routine given that he is about to retire.  We also discussed the importance of watching his diet and limiting ice cream intake.  He has completed the remote patient monitoring study.

## 2021-09-04 NOTE — Patient Instructions (Addendum)
Medication Instructions:  Your Physician recommend you continue on your current medication as directed.    *If you need a refill on your cardiac medications before your next appointment, please call your pharmacy*  Lab Work: None ordered today   Testing/Procedures: None ordered today    Follow-Up: 03/06/2022 9:00 am with Dr Oval Linsey

## 2021-09-04 NOTE — Assessment & Plan Note (Signed)
Working on diet and exercise as above.  

## 2022-02-26 ENCOUNTER — Other Ambulatory Visit (HOSPITAL_BASED_OUTPATIENT_CLINIC_OR_DEPARTMENT_OTHER): Payer: Self-pay | Admitting: Cardiovascular Disease

## 2022-02-26 MED ORDER — SPIRONOLACTONE 25 MG PO TABS: 25.0000 mg | ORAL_TABLET | Freq: Every day | ORAL | 3 refills | Status: DC

## 2022-03-06 ENCOUNTER — Encounter (HOSPITAL_BASED_OUTPATIENT_CLINIC_OR_DEPARTMENT_OTHER): Payer: Self-pay | Admitting: Cardiovascular Disease

## 2022-03-06 ENCOUNTER — Encounter: Payer: Self-pay | Admitting: Internal Medicine

## 2022-03-06 ENCOUNTER — Ambulatory Visit (HOSPITAL_BASED_OUTPATIENT_CLINIC_OR_DEPARTMENT_OTHER): Payer: Medicare Other | Admitting: Cardiovascular Disease

## 2022-03-06 ENCOUNTER — Ambulatory Visit (INDEPENDENT_AMBULATORY_CARE_PROVIDER_SITE_OTHER): Payer: Medicare Other | Admitting: Internal Medicine

## 2022-03-06 VITALS — BP 120/80 | HR 70 | Temp 97.8°F | Ht 69.5 in | Wt 255.3 lb

## 2022-03-06 VITALS — BP 127/75 | HR 74 | Ht 69.0 in | Wt 256.6 lb

## 2022-03-06 DIAGNOSIS — E669 Obesity, unspecified: Secondary | ICD-10-CM | POA: Diagnosis not present

## 2022-03-06 DIAGNOSIS — Z23 Encounter for immunization: Secondary | ICD-10-CM | POA: Diagnosis not present

## 2022-03-06 DIAGNOSIS — I10 Essential (primary) hypertension: Secondary | ICD-10-CM

## 2022-03-06 DIAGNOSIS — Z125 Encounter for screening for malignant neoplasm of prostate: Secondary | ICD-10-CM | POA: Diagnosis not present

## 2022-03-06 DIAGNOSIS — Z0000 Encounter for general adult medical examination without abnormal findings: Secondary | ICD-10-CM | POA: Diagnosis not present

## 2022-03-06 DIAGNOSIS — I1A0 Resistant hypertension: Secondary | ICD-10-CM

## 2022-03-06 LAB — COMPREHENSIVE METABOLIC PANEL
ALT: 25 U/L (ref 0–53)
AST: 21 U/L (ref 0–37)
Albumin: 4.6 g/dL (ref 3.5–5.2)
Alkaline Phosphatase: 41 U/L (ref 39–117)
BUN: 16 mg/dL (ref 6–23)
CO2: 31 mEq/L (ref 19–32)
Calcium: 9.6 mg/dL (ref 8.4–10.5)
Chloride: 97 mEq/L (ref 96–112)
Creatinine, Ser: 1.2 mg/dL (ref 0.40–1.50)
GFR: 62.99 mL/min (ref >60.00)
Glucose, Bld: 104 mg/dL — ABNORMAL HIGH (ref 70–99)
Potassium: 4 mEq/L (ref 3.5–5.1)
Sodium: 136 mEq/L (ref 135–145)
Total Bilirubin: 0.5 mg/dL (ref 0.2–1.2)
Total Protein: 6.8 g/dL (ref 6.0–8.3)

## 2022-03-06 LAB — CBC WITH DIFFERENTIAL/PLATELET
Basophils Absolute: 0 10*3/uL (ref 0.0–0.1)
Basophils Relative: 0.3 % (ref 0.0–3.0)
Eosinophils Absolute: 0.1 10*3/uL (ref 0.0–0.7)
Eosinophils Relative: 1.8 % (ref 0.0–5.0)
HCT: 46.8 % (ref 39.0–52.0)
Hemoglobin: 16.3 g/dL (ref 13.0–17.0)
Lymphocytes Relative: 39.2 % (ref 12.0–46.0)
Lymphs Abs: 2.4 10*3/uL (ref 0.7–4.0)
MCHC: 34.8 g/dL (ref 30.0–36.0)
MCV: 85.7 fl (ref 78.0–100.0)
Monocytes Absolute: 0.5 10*3/uL (ref 0.1–1.0)
Monocytes Relative: 7.9 % (ref 3.0–12.0)
Neutro Abs: 3.2 10*3/uL (ref 1.4–7.7)
Neutrophils Relative %: 50.8 % (ref 43.0–77.0)
Platelets: 209 10*3/uL (ref 150.0–400.0)
RBC: 5.46 Mil/uL (ref 4.22–5.81)
RDW: 13.2 % (ref 11.5–15.5)
WBC: 6.2 10*3/uL (ref 4.0–10.5)

## 2022-03-06 LAB — LIPID PANEL
Cholesterol: 156 mg/dL (ref 0–200)
HDL: 36.8 mg/dL — ABNORMAL LOW (ref >39.00)
NonHDL: 118.91
Total CHOL/HDL Ratio: 4
Triglycerides: 226 mg/dL — ABNORMAL HIGH (ref 0.0–149.0)
VLDL: 45.2 mg/dL — ABNORMAL HIGH (ref 0.0–40.0)

## 2022-03-06 LAB — VITAMIN B12: Vitamin B-12: 198 pg/mL — ABNORMAL LOW (ref 211–911)

## 2022-03-06 LAB — TSH: TSH: 1.39 u[IU]/mL (ref 0.35–5.50)

## 2022-03-06 LAB — HEMOGLOBIN A1C: Hgb A1c MFr Bld: 5.9 % (ref 4.6–6.5)

## 2022-03-06 LAB — VITAMIN D 25 HYDROXY (VIT D DEFICIENCY, FRACTURES): VITD: 48.8 ng/mL (ref 30.00–100.00)

## 2022-03-06 LAB — PSA: PSA: 1.38 ng/mL (ref 0.10–4.00)

## 2022-03-06 LAB — LDL CHOLESTEROL, DIRECT: Direct LDL: 91 mg/dL

## 2022-03-06 NOTE — Patient Instructions (Signed)
Medication Instructions:  ?Your physician recommends that you continue on your current medications as directed. Please refer to the Current Medication list given to you today.  ? ?Labwork: ?NONE ? ?Testing/Procedures: ?NONE ? ?Follow-Up: ?AS NEEDED  ? ?  ?

## 2022-03-06 NOTE — Progress Notes (Signed)
Advanced Hypertension Clinic Follow-up:    Date:  03/06/2022   ID:  Jackson Alexander, Jackson Alexander 1955-07-24, MRN [id number]  PCP:  Jackson Alexander, Jackson Halsted, MD  Cardiologist:  None  Nephrologist:  Referring MD: Jackson Alexander, Jackson Alexander*   CC: Hypertension  History of Present Illness:    Jackson Alexander is a 66 y.o. male with a hx of hypertension, chronic venous insufficiency, and obesity, here for follow-up.  He was initially seen 04/24/2021 in the [facility]pertension Clinic. He saw Dr. Isaac Alexander 03/13/2021 and his blood pressure was 146/80 on a regimen of 100 mg losartan daily, 25 mg HCTZ daily, 100 mg metoprolol twice daily, and 75 mg hydralazine 3 times daily. On 01/16/21 0.1 mg clonidine twice daily had also been added. Due to his blood pressure remaining uncontrolled overnight despite compliance with 5 antihypertensives, he was referred to the Advanced Hypertension Clinic.  He has struggled with hypertension for at least several years. He had intermittent LE edema for years, worse while on amlodipine previously. He also reported drowsiness after taking clonidine. He was drinking 2-3 cups of coffee and only sleeping 4-5 hours a night. Spironolactone 25 mg daily was added. Losartan/HCTZ was switched to valsartan/HCTZ  320/25 mg daily. Metoprolol was switched to carvedilol 25 mg BID. He was enrolled in our RPM study through [place]. He was referred to [facility] and referred for a sleep study. He followed up with our pharmacist 06/27/2021 and his BP was 122/75 with no recurrent hypotensive symptoms. At the last visit his blood pressures were running in the 100s-120s/60s-70s.  Today, he is feeling okay with no new cardiovascular concerns. At home his blood pressures have been similar or a little higher compared with today's in clinic reading 127/75. This past June he retired and has been adjusting to his new lifestyle. He is doing more yard work lately with no significant exertional symptoms.  Although he does confirm still having sun sensitivity. He has a follow-up appointment with his PCP later this afternoon. He denies any palpitations, chest pain, shortness of breath, or peripheral edema. No lightheadedness, headaches, syncope, orthopnea, or PND.  Previous antihypertensives: Amlodipine - peripheral edema  Past Medical History:  Diagnosis Date   Chronic venous insufficiency    Hx of adenomatous polyp of colon 11/14/2019   10/2019 diminutive adenoma recall 2028   Hypertension    Obesity (BMI 30-39.9)    Snoring 04/24/2021    Past Surgical History:  Procedure Laterality Date   COLONOSCOPY  07/26/2008   Dr.Orr-hyperplastic  polyps    Current Medications: Current Meds  Medication Sig   carvedilol (COREG) 12.5 MG tablet Take 1 tablet (12.5 mg total) by mouth 2 (two) times daily.   fexofenadine (ALLEGRA) 180 MG tablet Take 180 mg by mouth daily.   hydrALAZINE (APRESOLINE) 50 MG tablet Take 1 tablet (50 mg total) by mouth 3 (three) times daily.   OVER THE COUNTER MEDICATION Vitamin D3 50 mcg 2,000 iu per day   sodium chloride (OCEAN) 0.65 % SOLN nasal spray Place 2 sprays into both nostrils every 2 (two) hours while awake.   spironolactone (ALDACTONE) 25 MG tablet Take 1 tablet (25 mg total) by mouth daily.   valsartan-hydrochlorothiazide (DIOVAN HCT) 160-12.5 MG tablet Take 1 tablet by mouth daily.     Allergies:   Amlodipine besylate   Social History   Socioeconomic History   Marital status: Married    Spouse name: Not on file   Number of children: Not on file  Years of education: Not on file   Highest education level: Some college, no degree  Occupational History   Not on file  Tobacco Use   Smoking status: Never   Smokeless tobacco: Never  Vaping Use   Vaping Use: Never used  Substance and Sexual Activity   Alcohol use: Yes    Alcohol/week: 1.0 standard drink of alcohol    Types: 1 Glasses of wine per week   Drug use: Never   Sexual activity: Not on  file  Other Topics Concern   Not on file  Social History Narrative   Not on file   Social Determinants of Health   Financial Resource Strain: Low Risk  (03/11/2021)   Overall Financial Resource Strain (CARDIA)    Difficulty of Paying Living Expenses: Not hard at all  Food Insecurity: No Food Insecurity (03/11/2021)   Hunger Vital Sign    Worried About Running Out of Food in the Last Year: Never true    Ran Out of Food in the Last Year: Never true  Transportation Needs: No Transportation Needs (03/11/2021)   PRAPARE - Hydrologist (Medical): No    Lack of Transportation (Non-Medical): No  Physical Activity: Insufficiently Active (03/11/2021)   Exercise Vital Sign    Days of Exercise per Week: 4 days    Minutes of Exercise per Session: 20 min  Stress: No Stress Concern Present (03/11/2021)   [place]    Feeling of Stress : Not at all  Social Connections: Unknown (03/11/2021)   Social Connection and Isolation Panel [NHANES]    Frequency of Communication with Friends and Family: More than three times a week    Frequency of Social Gatherings with Friends and Family: Once a week    Attends Religious Services: Patient refused    Marine scientist or Organizations: No    Attends Music therapist: Not on file    Marital Status: Married     Family History: The patient's family history includes Breast cancer in his maternal aunt, maternal grandmother, sister, sister, and sister; Colon polyps in his brother and sister; Heart failure in his brother; Lung cancer in his father; Melanoma in his brother; Prostate cancer in his maternal grandfather; Renal cancer in his brother and maternal uncle; Sleep apnea in his brother, sister, and sister; Stomach cancer in his mother. There is no history of Colon cancer, Esophageal cancer, or Rectal cancer.  ROS:   Please see the history of  present illness.    All other systems reviewed and are negative.  EKGs/Labs/Other Studies Reviewed:    CTA Chest  05/21/2004: Findings: the lungs are clear. No pleural or pericardial fluid. No mediastinal or hilar adenopathy. The aorta has a normal appearance without evidence of dissection. No atherosclerotic change or aneurysm. No abnormality of the pulmonary arterial tree. Scans in the upper abdomen do not show any pathologic finding.  IMPRESSION:    Negative CT angiogram of the chest. No evidence of aortic pathology. See above for full report.   EKG:  EKG is personally reviewed. 03/06/2022:  EKG was not ordered. 09/04/2021:  EKG was not ordered. 04/24/2021: Sinus rhythm. Rate 74 bpm.  Recent Labs: 05/01/2021: BUN 20; Creatinine, Ser 1.17; Potassium 4.7; Sodium 139   Recent Lipid Panel    Component Value Date/Time   CHOL 149 08/14/2020 0912   TRIG 97 08/14/2020 0912   HDL 42 08/14/2020 0912   CHOLHDL  3.5 08/14/2020 0912   CHOLHDL 3.9 10/06/2019 0833   LDLCALC 89 08/14/2020 0912   LDLCALC 92 10/06/2019 0833    Physical Exam:    VS:  BP 127/75 (BP Location: Left Arm, Patient Position: Sitting, Cuff Size: Large)   Pulse 74   Ht '5\' 9"'$  (1.753 m)   Wt 256 lb 9.6 oz (116.4 kg)   SpO2 93%   BMI 37.89 kg/m  , BMI Body mass index is 37.89 kg/m. GENERAL:  Well appearing HEENT: Pupils equal round and reactive, fundi not visualized, oral mucosa unremarkable NECK:  No jugular venous distention, waveform within normal limits, carotid upstroke brisk and symmetric, no bruits, no thyromegaly LUNGS:  Clear to auscultation bilaterally HEART:  RRR.  PMI not displaced or sustained,S1 and S2 within normal limits, no S3, no S4, no clicks, no rubs, no murmurs ABD:  Flat, positive bowel sounds normal in frequency in pitch, no bruits, no rebound, no guarding, no midline pulsatile mass, no hepatomegaly, no splenomegaly EXT:  2 plus pulses throughout, no edema, no cyanosis no clubbing SKIN:  No rashes  no nodules NEURO:  Cranial nerves II through XII grossly intact, motor grossly intact throughout PSYCH:  Cognitively intact, oriented to person place and time   ASSESSMENT/PLAN:    Resistant hypertension Blood pressures have been stable and much better controlled.  Continue carvedilol, hydralazine, spironolactone, and valsartan/HCTZ.  He is going to see his PCP today so we will not check labs this morning.  Encouraged him to work on increasing his exercise to at least 150 minutes weekly.  At this point he can graduate from advanced hypertension clinic and follow-up with [place] on an as needed basis.  Obesity (BMI 30-39.9) Continue working on diet and exercise as above.   Screening for Secondary Hypertension: 65    04/24/2021    4:31 PM  Causes  Drugs/Herbals Screened     - Comments 2-3 cups of coffee daily, occasional EtOH.  Limits salt.  Sleep Apnea Screened     - Comments snores, daytime somnolence    Relevant Labs/Studies:    Latest Ref Rng & Units 05/01/2021   10:44 AM 08/14/2020    9:12 AM 05/24/2020    9:04 AM  Basic Labs  Sodium 134 - 144 mmol/L 139  137  135   Potassium 3.5 - 5.2 mmol/L 4.7  3.7  4.0   Creatinine 0.76 - 1.27 mg/dL 1.17  0.94  1.12        Latest Ref Rng & Units 08/14/2020    9:12 AM 10/06/2019    8:33 AM  Thyroid   TSH 0.450 - 4.500 uIU/mL 1.640  1.27      Disposition:    FU with Judi Jaffe C. Oval Linsey, MD, [facility] as needed.  Medication Adjustments/Labs and Tests Ordered: Current medicines are reviewed at length with the patient today.  Concerns regarding medicines are outlined above.   No orders of the defined types were placed in this encounter.  No orders of the defined types were placed in this encounter.  I,Mathew Stumpf,acting as a Education administrator for Skeet Latch, MD.,have documented all relevant documentation on the behalf of Skeet Latch, MD,as directed by  Skeet Latch, MD while in the presence of Skeet Latch, MD.  I, [place]  Oval Linsey, MD have reviewed all documentation for this visit.  The documentation of the exam, diagnosis, procedures, and orders on 03/06/2022 are all accurate and complete.  Signed, Skeet Latch, MD  03/06/2022 9:04 AM    [facility]

## 2022-03-06 NOTE — Addendum Note (Signed)
Addended by: Westley Hummer B on: 03/06/2022 01:57 PM   Modules accepted: Orders

## 2022-03-06 NOTE — Progress Notes (Signed)
Established Patient Office Visit     CC/Reason for Visit: Annual preventive exam and welcome to Medicare visit  HPI: Jackson Alexander is a 66 y.o. male who is coming in today for the above mentioned reasons. Past Medical History is significant for: Hypertension and obesity.  As of today he has been released from the advanced hypertension clinic due to improvement in blood pressure management.  He has routine eye and dental care, no perceived hearing difficulty.  He is due for COVID, flu, pneumonia, RSV vaccines.  He had a colonoscopy in 2021.  He feels well and has no acute concerns or complaints.   Past Medical/Surgical History: Past Medical History:  Diagnosis Date   Chronic venous insufficiency    Hx of adenomatous polyp of colon 11/14/2019   10/2019 diminutive adenoma recall 2028   Hypertension    Obesity (BMI 30-39.9)    Snoring 04/24/2021    Past Surgical History:  Procedure Laterality Date   COLONOSCOPY  07/26/2008   Dr.Orr-hyperplastic  polyps    Social History:  reports that he has never smoked. He has never used smokeless tobacco. He reports current alcohol use of about 1.0 standard drink of alcohol per week. He reports that he does not use drugs.  Allergies: Allergies  Allergen Reactions   Amlodipine Besylate Photosensitivity    Family History:  Family History  Problem Relation Age of Onset   Stomach cancer Mother    Lung cancer Father    Breast cancer Sister    Colon polyps Sister    Sleep apnea Sister    Sleep apnea Sister    Breast cancer Sister    Breast cancer Sister    Heart failure Brother    Sleep apnea Brother    Renal cancer Brother    Colon polyps Brother    Melanoma Brother    Breast cancer Maternal Aunt    Renal cancer Maternal Uncle    Breast cancer Maternal Grandmother    Prostate cancer Maternal Grandfather    Colon cancer Neg Hx    Esophageal cancer Neg Hx    Rectal cancer Neg Hx      Current Outpatient Medications:     carvedilol (COREG) 12.5 MG tablet, Take 1 tablet (12.5 mg total) by mouth 2 (two) times daily., Disp: 180 tablet, Rfl: 3   fexofenadine (ALLEGRA) 180 MG tablet, Take 180 mg by mouth daily., Disp: , Rfl:    hydrALAZINE (APRESOLINE) 50 MG tablet, Take 1 tablet (50 mg total) by mouth 3 (three) times daily., Disp: 270 tablet, Rfl: 3   OVER THE COUNTER MEDICATION, Vitamin D3 50 mcg 2,000 iu per day, Disp: , Rfl:    sodium chloride (OCEAN) 0.65 % SOLN nasal spray, Place 2 sprays into both nostrils every 2 (two) hours while awake., Disp: , Rfl: 0   spironolactone (ALDACTONE) 25 MG tablet, Take 1 tablet (25 mg total) by mouth daily., Disp: 90 tablet, Rfl: 3   valsartan-hydrochlorothiazide (DIOVAN HCT) 160-12.5 MG tablet, Take 1 tablet by mouth daily., Disp: 90 tablet, Rfl: 3  Review of Systems:  Constitutional: Denies fever, chills, diaphoresis, appetite change and fatigue.  HEENT: Denies photophobia, eye pain, redness, hearing loss, ear pain, congestion, sore throat, rhinorrhea, sneezing, mouth sores, trouble swallowing, neck pain, neck stiffness and tinnitus.   Respiratory: Denies SOB, DOE, cough, chest tightness,  and wheezing.   Cardiovascular: Denies chest pain, palpitations and leg swelling.  Gastrointestinal: Denies nausea, vomiting, abdominal pain, diarrhea, constipation, blood in  stool and abdominal distention.  Genitourinary: Denies dysuria, urgency, frequency, hematuria, flank pain and difficulty urinating.  Endocrine: Denies: hot or cold intolerance, sweats, changes in hair or nails, polyuria, polydipsia. Musculoskeletal: Denies myalgias, back pain, joint swelling, arthralgias and gait problem.  Skin: Denies pallor, rash and wound.  Neurological: Denies dizziness, seizures, syncope, weakness, light-headedness, numbness and headaches.  Hematological: Denies adenopathy. Easy bruising, personal or family bleeding history  Psychiatric/Behavioral: Denies suicidal ideation, mood changes, confusion,  nervousness, sleep disturbance and agitation    Physical Exam: Vitals:   03/06/22 1301  BP: 120/80  Pulse: 70  Temp: 97.8 F (36.6 C)  TempSrc: Oral  SpO2: 96%  Weight: 255 lb 4.8 oz (115.8 kg)  Height: 5' 9.5" (1.765 m)    Body mass index is 37.16 kg/m.   Constitutional: NAD, calm, comfortable Eyes: PERRL, lids and conjunctivae normal, wears corrective lenses ENMT: Mucous membranes are moist. Posterior pharynx clear of any exudate or lesions.  Poor dentition. Tympanic membrane is pearly white, no erythema or bulging. Neck: normal, supple, no masses, no thyromegaly Respiratory: clear to auscultation bilaterally, no wheezing, no crackles. Normal respiratory effort. No accessory muscle use.  Cardiovascular: Regular rate and rhythm, no murmurs / rubs / gallops. No extremity edema. 2+ pedal pulses. No carotid bruits.  Abdomen: no tenderness, no masses palpated. No hepatosplenomegaly. Bowel sounds positive.  Musculoskeletal: no clubbing / cyanosis. No joint deformity upper and lower extremities. Good ROM, no contractures. Normal muscle tone.  Skin: no rashes, lesions, ulcers. No induration Neurologic: CN 2-12 grossly intact. Sensation intact, DTR normal. Strength 5/5 in all 4.  Psychiatric: Normal judgment and insight. Alert and oriented x 3. Normal mood.   Initial Medicare wellness visit   1. Risk factors, based on past  M,S,F -cardiovascular disease risk factors include age, gender, obesity, hypertension   2.  Physical activities: Sedentary   3.  Depression/mood: Mood is stable   4.  Hearing: No perceived deficits   5.  ADL's: Independent in all ADLs   6.  Fall risk: Low fall risk   7.  Home safety: No problems identified   8.  Height weight, and visual acuity: height and weight as above, vision:  Vision Screening   Right eye Left eye Both eyes  Without correction     With correction '20/25 20/25 20/25 '$     9.  Counseling: Advised to update all age-appropriate  immunizations and increase physical activity   10. Lab orders based on risk factors: Laboratory update will be reviewed   11. Referral : None today   12. Care plan: Follow-up with me in 6 to 12 months   13. Cognitive assessment: No cognitive impairment   14. Screening: Patient provided with a written and personalized 5-10 year screening schedule in the AVS. yes   15. Provider List Update: PCP only  16. Advance Directives: Full code   17. Opioids: Patient is not on any opioid prescriptions and has no risk factors for a substance use disorder.   [place] Office Visit from 03/06/2022 in [place] [facility]  PHQ-9 Total Score 1          03/11/2021    2:13 PM 03/13/2021    3:36 PM 03/06/2022    1:00 PM  [place] in the past year? 0 0 0  Was there an injury with Fall?   0  Fall Risk Category Calculator   0  Fall Risk Category   Low  Patient Fall Risk  Level  Low fall risk Low fall risk  Patient at Risk for Falls Due to   No Fall Risks  Fall risk Follow up   Falls evaluation completed      Impression and Plan:  Welcome to Medicare preventive visit - Plan: PSA, PSA  Essential hypertension - Plan: CBC with Differential/Platelet, Comprehensive metabolic panel, Lipid panel, Lipid panel, Comprehensive metabolic panel, CBC with Differential/Platelet  Obesity (BMI 30-39.9) - Plan: Hemoglobin A1c, TSH, Vitamin B12, VITAMIN D 25 Hydroxy (Vit-D Deficiency, Fractures), VITAMIN D 25 Hydroxy (Vit-D Deficiency, Fractures), Vitamin B12, TSH, Hemoglobin A1c  Need for influenza vaccination  Need for vaccination against Streptococcus pneumoniae  -Recommend routine eye and dental care. -Immunizations: Flu and PCV 20 in office today, advised to update COVID and RSV at pharmacy -Healthy lifestyle discussed in detail. -Labs to be updated today. -Colon cancer screening: 10/2019, 5-year callback -Breast cancer screening: Not applicable -Cervical cancer screening:  Not applicable -Lung cancer screening: Not applicable -Prostate cancer screening: PSA -DEXA: Not applicable     Patient Instructions  -Nice seeing you today!!  -Lab work today; will notify you once results are available.  -FLU and pneumonia vaccines today.  -Remember COVID and RSV vaccines at the pharmacy.  -See you back in 1 year or sooner as needed.      Lelon Frohlich, MD [facility]

## 2022-03-06 NOTE — Assessment & Plan Note (Signed)
Blood pressures have been stable and much better controlled.  Continue carvedilol, hydralazine, spironolactone, and valsartan/HCTZ.  He is going to see his PCP today so we will not check labs this morning.  Encouraged him to work on increasing his exercise to at least 150 minutes weekly.  At this point he can graduate from advanced hypertension clinic and follow-up with [place] on an as needed basis.

## 2022-03-06 NOTE — Patient Instructions (Signed)
-  Nice seeing you today!!  -Lab work today; will notify you once results are available.  -FLU and pneumonia vaccines today.  -Remember COVID and RSV vaccines at the pharmacy.  -See you back in 1 year or sooner as needed.

## 2022-03-06 NOTE — Assessment & Plan Note (Signed)
Continue working on diet and exercise as above.

## 2022-03-06 NOTE — Progress Notes (Signed)
Advanced Hypertension Clinic Follow-up:    Date:  03/06/2022   ID:  Jackson Alexander, Jackson Alexander August 28, 1955, MRN [id number]  PCP:  Jackson Alexander, Jackson Halsted, MD  Cardiologist:  None  Nephrologist:  Referring MD: Jackson Alexander, Jackson Alexander*   CC: Hypertension  History of Present Illness:    Jackson Alexander is a 66 y.o. male with a hx of hypertension, chronic venous insufficiency, and obesity, here for follow-up.  He was initially seen 04/24/2021 in the [facility]. He saw Dr. Isaac Alexander 03/13/2021 and his blood pressure was 146/80 on a regimen of 100 mg losartan daily, 25 mg HCTZ daily, 100 mg metoprolol twice daily, and 75 mg hydralazine 3 times daily. On 01/16/21 0.1 mg clonidine twice daily had also been added. Due to his blood pressure remaining uncontrolled overnight despite compliance with 5 antihypertensives, he was referred to the [facility]pert[facility].  He has struggled with hypertension for at least several years. He had intermittent LE edema for years, worse while on amlodipine previously. He also reported drowsiness after taking clonidine. He was drinking 2-3 cups of coffee and only sleeping 4-5 hours a night. Spironolactone 25 mg daily was added. Losartan/HCTZ was switched to valsartan/HCTZ  320/25 mg daily. Metoprolol was switched to carvedilol 25 mg BID. He was enrolled in our RPM study through [facility]. He was referred to [facility] and referred for a sleep study. He followed up with our pharmacist 06/27/2021 and his BP was 122/75 with no recurrent hypotensive symptoms. At the last visit his blood pressures were running in the 100s-120s/60s-70s.  Today, he is feeling okay with no new cardiovascular concerns. At home his blood pressures have been similar or a little higher compared with today's in clinic reading 127/75. This past June he retired and has been adjusting to his new lifestyle. He is doing more yard work lately with no significant exertional symptoms.  Although he does confirm still having sun sensitivity. He has a follow-up appointment with his PCP later this afternoon. He denies any palpitations, chest pain, shortness of breath, or peripheral edema. No lightheadedness, headaches, syncope, orthopnea, or PND.  Previous antihypertensives: Amlodipine - peripheral edema  Past Medical History:  Diagnosis Date   Chronic venous insufficiency    Hx of adenomatous polyp of colon 11/14/2019   10/2019 diminutive adenoma recall 2028   Hypertension    Obesity (BMI 30-39.9)    Snoring 04/24/2021    Past Surgical History:  Procedure Laterality Date   COLONOSCOPY  07/26/2008   Dr.Orr-hyperplastic  polyps    Current Medications: Current Meds  Medication Sig   carvedilol (COREG) 12.5 MG tablet Take 1 tablet (12.5 mg total) by mouth 2 (two) times daily.   fexofenadine (ALLEGRA) 180 MG tablet Take 180 mg by mouth daily.   hydrALAZINE (APRESOLINE) 50 MG tablet Take 1 tablet (50 mg total) by mouth 3 (three) times daily.   OVER THE COUNTER MEDICATION Vitamin D3 50 mcg 2,000 iu per day   sodium chloride (OCEAN) 0.65 % SOLN nasal spray Place 2 sprays into both nostrils every 2 (two) hours while awake.   spironolactone (ALDACTONE) 25 MG tablet Take 1 tablet (25 mg total) by mouth daily.   valsartan-hydrochlorothiazide (DIOVAN HCT) 160-12.5 MG tablet Take 1 tablet by mouth daily.     Allergies:   Amlodipine besylate   Social History   Socioeconomic History   Marital status: Married    Spouse name: Not on file   Number of children: Not on file  Years of education: Not on file   Highest education level: Some college, no degree  Occupational History   Not on file  Tobacco Use   Smoking status: Never   Smokeless tobacco: Never  Vaping Use   Vaping Use: Never used  Substance and Sexual Activity   Alcohol use: Yes    Alcohol/week: 1.0 standard drink of alcohol    Types: 1 Glasses of wine per week   Drug use: Never   Sexual activity: Not on  file  Other Topics Concern   Not on file  Social History Narrative   Not on file   Social Determinants of Health   Financial Resource Strain: Low Risk  (03/11/2021)   Overall Financial Resource Strain (CARDIA)    Difficulty of Paying Living Expenses: Not hard at all  Food Insecurity: No Food Insecurity (03/11/2021)   Hunger Vital Sign    Worried About Running Out of Food in the Last Year: Never true    Ran Out of Food in the Last Year: Never true  Transportation Needs: No Transportation Needs (03/11/2021)   PRAPARE - Hydrologist (Medical): No    Lack of Transportation (Non-Medical): No  Physical Activity: Insufficiently Active (03/11/2021)   Exercise Vital Sign    Days of Exercise per Week: 4 days    Minutes of Exercise per Session: 20 min  Stress: No Stress Concern Present (03/11/2021)   [place]    Feeling of Stress : Not at all  Social Connections: Unknown (03/11/2021)   Social Connection and Isolation Panel [NHANES]    Frequency of Communication with Friends and Family: More than three times a week    Frequency of Social Gatherings with Friends and Family: Once a week    Attends Religious Services: Patient refused    Marine scientist or Organizations: No    Attends Music therapist: Not on file    Marital Status: Married     Family History: The patient's family history includes Breast cancer in his maternal aunt, maternal grandmother, sister, sister, and sister; Colon polyps in his brother and sister; Heart failure in his brother; Lung cancer in his father; Melanoma in his brother; Prostate cancer in his maternal grandfather; Renal cancer in his brother and maternal uncle; Sleep apnea in his brother, sister, and sister; Stomach cancer in his mother. There is no history of Colon cancer, Esophageal cancer, or Rectal cancer.  ROS:   Please see the history of  present illness.    All other systems reviewed and are negative.  EKGs/Labs/Other Studies Reviewed:    CTA Chest  05/21/2004: Findings: the lungs are clear. No pleural or pericardial fluid. No mediastinal or hilar adenopathy. The aorta has a normal appearance without evidence of dissection. No atherosclerotic change or aneurysm. No abnormality of the pulmonary arterial tree. Scans in the upper abdomen do not show any pathologic finding.  IMPRESSION:    Negative CT angiogram of the chest. No evidence of aortic pathology. See above for full report.   EKG:  EKG is personally reviewed. 03/06/2022:  EKG was not ordered. 09/04/2021:  EKG was not ordered. 04/24/2021: Sinus rhythm. Rate 74 bpm.  Recent Labs: 05/01/2021: BUN 20; Creatinine, Ser 1.17; Potassium 4.7; Sodium 139   Recent Lipid Panel    Component Value Date/Time   CHOL 149 08/14/2020 0912   TRIG 97 08/14/2020 0912   HDL 42 08/14/2020 0912   CHOLHDL  3.5 08/14/2020 0912   CHOLHDL 3.9 10/06/2019 0833   LDLCALC 89 08/14/2020 0912   LDLCALC 92 10/06/2019 0833    Physical Exam:    VS:  BP 127/75 (BP Location: Left Arm, Patient Position: Sitting, Cuff Size: Large)   Pulse 74   Ht '5\' 9"'$  (1.753 m)   Wt 256 lb 9.6 oz (116.4 kg)   SpO2 93%   BMI 37.89 kg/m  , BMI Body mass index is 37.89 kg/m. GENERAL:  Well appearing HEENT: Pupils equal round and reactive, fundi not visualized, oral mucosa unremarkable NECK:  No jugular venous distention, waveform within normal limits, carotid upstroke brisk and symmetric, no bruits, no thyromegaly LUNGS:  Clear to auscultation bilaterally HEART:  RRR.  PMI not displaced or sustained,S1 and S2 within normal limits, no S3, no S4, no clicks, no rubs, no murmurs ABD:  Flat, positive bowel sounds normal in frequency in pitch, no bruits, no rebound, no guarding, no midline pulsatile mass, no hepatomegaly, no splenomegaly EXT:  2 plus pulses throughout, no edema, no cyanosis no clubbing SKIN:  No rashes  no nodules NEURO:  Cranial nerves II through XII grossly intact, motor grossly intact throughout PSYCH:  Cognitively intact, oriented to person place and time   ASSESSMENT/PLAN:    Resistant hypertension Blood pressures have been stable and much better controlled.  Continue carvedilol, hydralazine, spironolactone, and valsartan/HCTZ.  He is going to see his PCP today so we will not check labs this morning.  Encouraged him to work on increasing his exercise to at least 150 minutes weekly.  At this point he can graduate from advanced hypertension clinic and follow-up with [place] on an as needed basis.  Obesity (BMI 30-39.9) Continue working on diet and exercise as above.   Screening for Secondary Hypertension: 65    04/24/2021    4:31 PM  Causes  Drugs/Herbals Screened     - Comments 2-3 cups of coffee daily, occasional EtOH.  Limits salt.  Sleep Apnea Screened     - Comments snores, daytime somnolence    Relevant Labs/Studies:    Latest Ref Rng & Units 05/01/2021   10:44 AM 08/14/2020    9:12 AM 05/24/2020    9:04 AM  Basic Labs  Sodium 134 - 144 mmol/L 139  137  135   Potassium 3.5 - 5.2 mmol/L 4.7  3.7  4.0   Creatinine 0.76 - 1.27 mg/dL 1.17  0.94  1.12        Latest Ref Rng & Units 08/14/2020    9:12 AM 10/06/2019    8:33 AM  Thyroid   TSH 0.450 - 4.500 uIU/mL 1.640  1.27      Disposition:    FU with Tiffany C. Oval Linsey, MD, [facility] as needed.  Medication Adjustments/Labs and Tests Ordered: Current medicines are reviewed at length with the patient today.  Concerns regarding medicines are outlined above.   No orders of the defined types were placed in this encounter.  No orders of the defined types were placed in this encounter.  I,Mathew Stumpf,acting as a Education administrator for Skeet Latch, MD.,have documented all relevant documentation on the behalf of Skeet Latch, MD,as directed by  Skeet Latch, MD while in the presence of Skeet Latch, MD.  I, [place]  Oval Linsey, MD have reviewed all documentation for this visit.  The documentation of the exam, diagnosis, procedures, and orders on 03/06/2022 are all accurate and complete.  Signed, Skeet Latch, MD  03/06/2022 9:03 AM    [facility]

## 2022-03-07 ENCOUNTER — Encounter: Payer: Self-pay | Admitting: Internal Medicine

## 2022-03-07 ENCOUNTER — Other Ambulatory Visit: Payer: Self-pay | Admitting: *Deleted

## 2022-03-07 DIAGNOSIS — E538 Deficiency of other specified B group vitamins: Secondary | ICD-10-CM | POA: Insufficient documentation

## 2022-03-07 MED ORDER — "BD SAFETYGLIDE SYRINGE/NEEDLE 25G X 1"" 3 ML MISC": 11 refills | Status: DC

## 2022-03-07 MED ORDER — CYANOCOBALAMIN 1000 MCG/ML IJ SOLN: INTRAMUSCULAR | 2 refills | Status: DC

## 2022-06-16 ENCOUNTER — Encounter: Payer: Self-pay | Admitting: Internal Medicine

## 2022-06-16 DIAGNOSIS — I10 Essential (primary) hypertension: Secondary | ICD-10-CM

## 2022-06-17 MED ORDER — HYDRALAZINE HCL 50 MG PO TABS: 50.0000 mg | ORAL_TABLET | Freq: Three times a day (TID) | ORAL | 0 refills | Status: DC

## 2022-06-17 NOTE — Telephone Encounter (Signed)
Please refill this for 90 days

## 2022-09-08 ENCOUNTER — Other Ambulatory Visit: Payer: Self-pay | Admitting: Internal Medicine

## 2022-09-08 DIAGNOSIS — I10 Essential (primary) hypertension: Secondary | ICD-10-CM

## 2022-11-10 ENCOUNTER — Encounter: Payer: Self-pay | Admitting: Internal Medicine

## 2022-11-11 ENCOUNTER — Other Ambulatory Visit: Payer: Self-pay | Admitting: Internal Medicine

## 2022-11-11 DIAGNOSIS — I1A0 Resistant hypertension: Secondary | ICD-10-CM

## 2022-11-11 MED ORDER — VALSARTAN-HYDROCHLOROTHIAZIDE 160-12.5 MG PO TABS: 1.0000 | ORAL_TABLET | Freq: Every day | ORAL | 1 refills | Status: DC

## 2022-11-26 MED ORDER — VALSARTAN-HYDROCHLOROTHIAZIDE 160-12.5 MG PO TABS: 1.0000 | ORAL_TABLET | Freq: Every day | ORAL | 0 refills | Status: DC

## 2022-12-19 ENCOUNTER — Other Ambulatory Visit: Payer: Self-pay | Admitting: Internal Medicine

## 2023-03-08 ENCOUNTER — Other Ambulatory Visit: Payer: Self-pay | Admitting: Internal Medicine

## 2023-03-08 DIAGNOSIS — I10 Essential (primary) hypertension: Secondary | ICD-10-CM

## 2023-03-19 ENCOUNTER — Other Ambulatory Visit (HOSPITAL_BASED_OUTPATIENT_CLINIC_OR_DEPARTMENT_OTHER): Payer: Self-pay | Admitting: Cardiovascular Disease

## 2023-04-22 ENCOUNTER — Encounter: Payer: Self-pay | Admitting: Internal Medicine

## 2023-04-23 MED ORDER — CARVEDILOL 12.5 MG PO TABS: 12.5000 mg | ORAL_TABLET | Freq: Two times a day (BID) | ORAL | 0 refills | Status: DC

## 2023-05-09 ENCOUNTER — Other Ambulatory Visit: Payer: Self-pay | Admitting: Internal Medicine

## 2023-05-12 ENCOUNTER — Other Ambulatory Visit: Payer: Self-pay | Admitting: Internal Medicine

## 2023-05-12 DIAGNOSIS — I10 Essential (primary) hypertension: Secondary | ICD-10-CM

## 2023-05-13 ENCOUNTER — Other Ambulatory Visit: Payer: Self-pay | Admitting: Internal Medicine

## 2023-05-14 MED ORDER — VALSARTAN-HYDROCHLOROTHIAZIDE 160-12.5 MG PO TABS: 1.0000 | ORAL_TABLET | Freq: Every day | ORAL | 0 refills | Status: DC

## 2023-06-03 ENCOUNTER — Ambulatory Visit (INDEPENDENT_AMBULATORY_CARE_PROVIDER_SITE_OTHER): Payer: Medicare Other | Admitting: Family Medicine

## 2023-06-03 VITALS — BP 117/73 | HR 84

## 2023-06-03 DIAGNOSIS — Z0000 Encounter for general adult medical examination without abnormal findings: Secondary | ICD-10-CM

## 2023-06-03 NOTE — Progress Notes (Signed)
 PATIENT CHECK-IN and HEALTH RISK ASSESSMENT QUESTIONNAIRE:  -completed by phone/video for upcoming Medicare Preventive Visit  Pre-Visit Check-in: 1)Vitals (height, wt, BP, etc) - record in vitals section for visit on day of visit Request home vitals (wt, BP, etc.) and enter into vitals, THEN update Vital Signs SmartPhrase below at the top of the HPI. See below.  2)Review and Update Medications, Allergies PMH, Surgeries, Social history in Epic 3)Hospitalizations in the last year with date/reason? n  4)Review and Update Care Team (patient's specialists) in Epic 5) Complete PHQ9 in Epic  6) Complete Fall Screening in Epic 7)Review all Health Maintenance Due and order under PCP if not done.  Medicare Wellness Patient Questionnaire:  Answer theses question about your habits: How often do you have a drink containing alcohol?1-2 glasses every 2-3 days. How often do you have six or more drinks on one occasion?never Have you ever smoked? n Do you use an illicit drugs?n On average, how many days per week do you engage in moderate to strenuous exercise (like a brisk walk)? 1 day per week On average, how many minutes do you engage in exercise at this level? 50 minutes Typical diet: feels diet is ok - he says he eats a lot of veggies, meats. Does eat some grains. Tries to limit sweets. Diet pepsi, water.   Answer theses question about your everyday activities: Can you perform most household chores?y Are you deaf or have significant trouble hearing?n Do you feel that you have a problem with memory?n Do you feel safe at home?y Last dentist visit? Just saw dentist - Dr. Maple Hudson 8. Do you have any difficulty performing your everyday activities?n Are you having any difficulty walking, taking medications on your own, and or difficulty managing daily home needs?n Do you have difficulty walking or climbing stairs?n Do you have difficulty dressing or bathing?n Do you have difficulty doing errands alone  such as visiting a doctor's office or shopping?n Do you currently have any difficulty preparing food and eating?n Do you currently have any difficulty using the toilet?n Do you have any difficulty managing your finances?n Do you have any difficulties with housekeeping of managing your housekeeping?n   Do you have Advanced Directives in place (Living Will, Healthcare Power or Attorney)?  no   Last eye Exam and location? Goes every 2 years    Do you currently use prescribed or non-prescribed narcotic or opioid pain medications?n  Do you have a history or close family history of breast, ovarian, tubal or peritoneal cancer or a family member with BRCA (breast cancer susceptibility 1 and 2) gene mutations? Breast cancer    ----------------------------------------------------------------------------------------------------------------------------------------------------------------------------------------------------------------------  Because this visit was a virtual/telehealth visit, some criteria may be missing or patient reported. Any vitals not documented were not able to be obtained and vitals that have been documented are patient reported.    MEDICARE ANNUAL PREVENTIVE CARE VISIT WITH PROVIDER (Welcome to Medicare, initial annual wellness or annual wellness exam)  Virtual Visit via Video Note  I connected with Jackson Alexander on 06/03/23  by video enabled telemedicine application and verified that I am speaking with the correct person using two identifiers.  Location patient: home Location provider:work or home office Persons participating in the virtual visit: patient, provider  Concerns and/or follow up today: stable, no concerns   See HM section in Epic for other details of completed HM.    ROS: negative for report of fevers, unintentional weight loss, vision changes, vision loss, hearing loss or change, chest  pain, sob, hemoptysis, melena, hematochezia, hematuria,  falls, bleeding or bruising, thoughts of suicide or self harm, memory loss  Patient-completed extensive health risk assessment - reviewed and discussed with the patient: See Health Risk Assessment completed with patient prior to the visit either above or in recent phone note. This was reviewed in detailed with the patient today and appropriate recommendations, orders and referrals were placed as needed per Summary below and patient instructions.   Review of Medical History: -PMH, PSH, Family History and current specialty and care providers reviewed and updated and listed below   Patient Care Team: Philip Aspen, Limmie Patricia, MD as PCP - General (Internal Medicine)   Past Medical History:  Diagnosis Date   Chronic venous insufficiency    Hx of adenomatous polyp of colon 11/14/2019   10/2019 diminutive adenoma recall 2028   Hypertension    Obesity (BMI 30-39.9)    Snoring 04/24/2021    Past Surgical History:  Procedure Laterality Date   COLONOSCOPY  07/26/2008   Dr.Orr-hyperplastic  polyps    Social History   Socioeconomic History   Marital status: Married    Spouse name: Not on file   Number of children: Not on file   Years of education: Not on file   Highest education level: Some college, no degree  Occupational History   Not on file  Tobacco Use   Smoking status: Never   Smokeless tobacco: Never  Vaping Use   Vaping status: Never Used  Substance and Sexual Activity   Alcohol use: Yes    Alcohol/week: 1.0 standard drink of alcohol    Types: 1 Glasses of wine per week   Drug use: Never   Sexual activity: Not on file  Other Topics Concern   Not on file  Social History Narrative   Not on file   Social Drivers of Health   Financial Resource Strain: Low Risk  (06/02/2023)   Overall Financial Resource Strain (CARDIA)    Difficulty of Paying Living Expenses: Not hard at all  Food Insecurity: No Food Insecurity (06/02/2023)   Hunger Vital Sign    Worried About Running  Out of Food in the Last Year: Never true    Ran Out of Food in the Last Year: Never true  Transportation Needs: No Transportation Needs (06/02/2023)   PRAPARE - Administrator, Civil Service (Medical): No    Lack of Transportation (Non-Medical): No  Physical Activity: Insufficiently Active (06/02/2023)   Exercise Vital Sign    Days of Exercise per Week: 1 day    Minutes of Exercise per Session: 50 min  Stress: No Stress Concern Present (06/02/2023)   [employer] of Occupational Health - Occupational Stress Questionnaire    Feeling of Stress : Not at all  Social Connections: Moderately Isolated (06/02/2023)   Social Connection and Isolation Panel [NHANES]    Frequency of Communication with Friends and Family: Three times a week    Frequency of Social Gatherings with Friends and Family: Twice a week    Attends Religious Services: Never    Database administrator or Organizations: No    Attends Engineer, structural: Not on file    Marital Status: Married  Catering manager Violence: Not on file    Family History  Problem Relation Age of Onset   Stomach cancer Mother    Lung cancer Father    Breast cancer Sister    Colon polyps Sister    Sleep apnea Sister  Sleep apnea Sister    Breast cancer Sister    Breast cancer Sister    Heart failure Brother    Sleep apnea Brother    Renal cancer Brother    Colon polyps Brother    Melanoma Brother    Breast cancer Maternal Aunt    Renal cancer Maternal Uncle    Breast cancer Maternal Grandmother    Prostate cancer Maternal Grandfather    Colon cancer Neg Hx    Esophageal cancer Neg Hx    Rectal cancer Neg Hx     Current Outpatient Medications on File Prior to Visit  Medication Sig Dispense Refill   carvedilol (COREG) 12.5 MG tablet Take 1 tablet (12.5 mg total) by mouth 2 (two) times daily. 180 tablet 0   cyanocobalamin (VITAMIN B12) 1000 MCG/ML injection INJECT IN DELTOID ONCE WEEKLY FOR 4 WEEKS, THEN  INJECT 1 ML ONCE A MONTH THEREAFTER 6 mL 2   fexofenadine (ALLEGRA) 180 MG tablet Take 180 mg by mouth daily.     hydrALAZINE (APRESOLINE) 50 MG tablet TAKE 1 TABLET BY MOUTH THREE TIMES A DAY 270 tablet 0   OVER THE COUNTER MEDICATION Vitamin D3 50 mcg 2,000 iu per day     sodium chloride (OCEAN) 0.65 % SOLN nasal spray Place 2 sprays into both nostrils every 2 (two) hours while awake.  0   spironolactone (ALDACTONE) 25 MG tablet TAKE 1 TABLET BY MOUTH EVERY DAY 90 tablet 2   SYRINGE-NEEDLE, DISP, 3 ML (BD SAFETYGLIDE SYRINGE/NEEDLE) 25G X 1" 3 ML MISC Use for B12 injections 100 each 11   valsartan-hydrochlorothiazide (DIOVAN HCT) 160-12.5 MG tablet Take 1 tablet by mouth daily. 90 tablet 1   valsartan-hydrochlorothiazide (DIOVAN-HCT) 160-12.5 MG tablet Take 1 tablet by mouth daily. 30 tablet 0   No current facility-administered medications on file prior to visit.    Allergies  Allergen Reactions   Amlodipine Besylate Photosensitivity       Physical Exam Vitals requested from patient and listed below if patient had equipment and was able to obtain at home for this virtual visit: Vitals:   06/03/23 1543  BP: 117/73  Pulse: 84   Estimated body mass index is 37.16 kg/m as calculated from the following:   Height as of 03/06/22: 5' 9.5" (1.765 m).   Weight as of 03/06/22: 255 lb 4.8 oz (115.8 kg).  EKG (optional): deferred due to virtual visit  GENERAL: alert, oriented, no acute distress detected; full vision exam deferred due to pandemic and/or virtual encounter  HEENT: atraumatic, conjunttiva clear, no obvious abnormalities on inspection of external nose and ears  NECK: normal movements of the head and neck  LUNGS: on inspection no signs of respiratory distress, breathing rate appears normal, no obvious gross SOB, gasping or wheezing  CV: no obvious cyanosis  MS: moves all visible extremities without noticeable abnormality  PSYCH/NEURO: pleasant and cooperative, no  obvious depression or anxiety, speech and thought processing grossly intact, Cognitive function grossly intact  Flowsheet Row Office Visit from 03/06/2022 in [facility]  PHQ-9 Total Score 1           06/03/2023    3:47 PM 03/06/2022    1:00 PM 03/13/2021    3:36 PM 08/30/2020    8:53 AM 08/13/2019   11:16 AM  Depression screen PHQ 2/9  Decreased Interest 0 0 0 0 0  Down, Depressed, Hopeless 0 0 0 0 0  PHQ - 2 Score 0 0 0  0 0  Altered sleeping  1 1 2  0  Tired, decreased energy  0 0 0 0  Change in appetite  0 0 0 0  Feeling bad or failure about yourself   0 0 0 0  Trouble concentrating  0 0 0 0  Moving slowly or fidgety/restless  0 0 0 0  Suicidal thoughts  0 0 0 0  PHQ-9 Score  1 1 2  0  Difficult doing work/chores  Not difficult at all Not difficult at all  Not difficult at all       03/11/2021    2:13 PM 03/13/2021    3:36 PM 03/06/2022    1:00 PM 06/03/2023    3:47 PM  Fall Risk  Falls in the past year? 0 0 0 1  Was there an injury with Fall?   0 0  Fall Risk Category Calculator   0 1  Fall Risk Category (Retired)   Low   (RETIRED) Patient Fall Risk Level  Low fall risk Low fall risk   Patient at Risk for Falls Due to   No Fall Risks   Fall risk Follow up   Falls evaluation completed Falls evaluation completed;Education provided     SUMMARY AND PLAN:  Encounter for Medicare annual wellness exam  Discussed applicable health maintenance/preventive health measures and advised and referred or ordered per patient preferences: -he reports he has physical in the office coming up and will bring his vaccine records then -discussed hep C and prostate cancer screening and he will get at physical if decides to do Health Maintenance  Topic Date Due   Hepatitis C Screening  Never done   INFLUENZA VACCINE  10/24/2022   COVID-19 Vaccine (5 - 2024-25 season) 11/24/2022   Medicare Annual Wellness (AWV)  06/02/2024   Colonoscopy  11/05/2026    DTaP/Tdap/Td (3 - Td or Tdap) 10/05/2029   Pneumonia Vaccine 57+ Years old  Completed   Zoster Vaccines- Shingrix  Completed   HPV VACCINES  Aged [employer] and counseling on the following was provided based on the above review of health and a plan/checklist for the patient, along with additional information discussed, was provided for the patient in the patient instructions :  -Advised on importance of completing advanced directives, discussed options for completing and provided information in patient instructions as well -Provided counseling and plan for increased risk of falling if applicable per above screening.Discussed safe balance exercises that can be done at home to improve balance and discussed exercise guidelines for adults with include balance exercises at least 3 days per week.  -Advised and counseled on a healthy lifestyle - including the importance of a healthy diet, regular physical activity, social connections  -Reviewed patient's current diet. Advised and counseled on a whole foods based healthy diet. A summary of a healthy diet was provided in the Patient Instructions.  -reviewed patient's current physical activity level and discussed exercise guidelines for adults. Discussed community resources and ideas for safe exercise at home to assist in meeting exercise guideline recommendations in a safe and healthy way.  -Advise yearly dental visits at minimum and regular eye exams -Advised and counseled on alcohol safe limits, risks  Follow up: see patient instructions   Patient Instructions  I really enjoyed getting to talk with you today! I am available on Tuesdays and Thursdays for virtual visits if you have any questions or concerns, or if I can be of any further assistance.   CHECKLIST  FROM ANNUAL WELLNESS VISIT:  -Follow up (please call to schedule if not scheduled after visit):   -in office physical as planned   -yearly for annual wellness visit with primary care  office  Here is a list of your preventive care/health maintenance measures and the plan for each if any are due:  PLAN For any measures below that may be due:   -can get the hepatitis C screening and prostate cancer screening at your physical - if you wish just request -please bring copy of the covid and flu vaccine records  Health Maintenance  Topic Date Due   Hepatitis C Screening  Never done   INFLUENZA VACCINE  10/24/2022   COVID-19 Vaccine (5 - 2024-25 season) 11/24/2022   Medicare Annual Wellness (AWV)  06/02/2024   Colonoscopy  11/05/2026   DTaP/Tdap/Td (3 - Td or Tdap) 10/05/2029   Pneumonia Vaccine 22+ Years old  Completed   Zoster Vaccines- Shingrix  Completed   HPV VACCINES  Aged Out    -See a dentist at least yearly  -Get your eyes checked and then per your eye specialist's recommendations  -Other issues addressed today:   -I have included below further information regarding a healthy whole foods based diet, physical activity guidelines for adults, stress management and opportunities for social connections. I hope you find this information useful.   -----------------------------------------------------------------------------------------------------------------------------------------------------------------------------------------------------------------------------------------------------------    NUTRITION: -eat real food: lots of colorful vegetables (half the plate) and fruits -5-7 servings of vegetables and fruits per day (fresh or steamed is best), exp. 2 servings of vegetables with lunch and dinner and 2 servings of fruit per day. Berries and greens such as kale and collards are great choices.  -consume on a regular basis:  fresh fruits, fresh veggies, fish, nuts, seeds, healthy oils (such as olive oil, avocado oil), whole grains (make sure for bread/pasta/crackers/etc., that the first ingredient on label contains the word "whole"), legumes. -can eat small  amounts of dairy and lean meat (no larger than the palm of your hand), but avoid processed meats such as ham, bacon, lunch meat, etc. -drink water -try to avoid fast food and pre-packaged foods, processed meat, ultra processed foods/beverages (donuts, candy, etc.) -most experts advise limiting sodium to < 2300mg  per day, should limit further is any chronic conditions such as high blood pressure, heart disease, diabetes, etc. The American Heart Association advised that < 1500mg  is is ideal -try to avoid foods/beverages that contain any ingredients with names you do not recognize  -try to avoid foods/beverages  with added sugar or sweeteners/sweets  -try to avoid sweet drinks (including diet drinks): soda, juice, Gatorade, sweet tea, power drinks, diet drinks -try to avoid white rice, white bread, pasta (unless whole grain)  EXERCISE GUIDELINES FOR ADULTS: -if you wish to increase your physical activity, do so gradually and with the approval of your doctor -STOP and seek medical care immediately if you have any chest pain, chest discomfort or trouble breathing when starting or increasing exercise  -move and stretch your body, legs, feet and arms when sitting for long periods -Physical activity guidelines for optimal health in adults: -get at least 150 minutes per week of moderate exercise (can talk, but not sing); this is about 20-30 minutes of sustained activity 5-7 days per week or two 10-15 minute episodes of sustained activity 5-7 days per week -do some muscle building/resistance training/strength training at least 2 days per week  -balance exercises 3+ days per week:   Stand somewhere where you have  something sturdy to hold onto if you lose balance    1) lift up on toes, then back down, start with 5x per day and work up to 20x   2) stand and lift one leg straight out to the side so that foot is a few inches of the floor, start with 5x each side and work up to 20x each side   3) stand on one  foot, start with 5 seconds each side and work up to 20 seconds on each side  If you need ideas or help with getting more active:  -Silver sneakers https://tools.silversneakers.com  -Walk with a Doc: http://www.duncan-williams.com/  -try to include resistance (weight lifting/strength building) and balance exercises twice per week: or the following link for ideas: http://castillo-powell.com/  BuyDucts.dk  STRESS MANAGEMENT: -can try meditating, or just sitting quietly with deep breathing while intentionally relaxing all parts of your body for 5 minutes daily -if you need further help with stress, anxiety or depression please follow up with your primary doctor or contact the wonderful folks at [facility]: [phone]  SOCIAL CONNECTIONS: -options in [place] if you wish to engage in more social and exercise related activities:  -Silver sneakers https://tools.silversneakers.com  -Walk with a Doc: http://www.duncan-williams.com/  -Check out the [facility] Active Adults 50+ section on the [place] [employer] (hiking clubs, book clubs, cards and games, chess, exercise classes, aquatic classes and much more) - see the website for details: https://www.[place].gov/departments/parks-recreation/active-adults50  -YouTube has lots of exercise videos for different ages and abilities as well  -[facility] (a variety of indoor and outdoor inperson activities for adults). 4054172906. [address]  -Virtual Online Classes (a variety of topics): see seniorplanet.org or call [phone]  -consider volunteering at a school, hospice center, church, senior center or elsewhere    ADVANCED HEALTHCARE DIRECTIVES:  [place] Advanced Directives assistance:   ExpressWeek.com.cy  Everyone should have advanced health  care directives in place. This is so that you get the care you want, should you ever be in a situation where you are unable to make your own medical decisions.   From the [place] Advanced Directive Website: "Advance Health Care Directives are legal documents in which you give written instructions about your health care if, in the future, you cannot speak for yourself.   A health care power of attorney allows you to name a person you trust to make your health care decisions if you cannot make them yourself. A declaration of a desire for a natural death (or living will) is document, which states that you desire not to have your life prolonged by extraordinary measures if you have a terminal or incurable illness or if you are in a vegetative state. An advance instruction for mental health treatment makes a declaration of instructions, information and preferences regarding your mental health treatment. It also states that you are aware that the advance instruction authorizes a mental health treatment provider to act according to your wishes. It may also outline your consent or refusal of mental health treatment. A declaration of an anatomical gift allows anyone over the age of 34 to make a gift by will, organ donor card or other document."   Please see the following website or an elder law attorney for forms, FAQs and for completion of advanced directives: [place] Health Care Directives Advance Health Care Directives (http://guzman.com/)  Or copy and paste the following to your web browser: PoshChat.fi  Terressa Koyanagi, DO

## 2023-06-03 NOTE — Patient Instructions (Signed)
 I really enjoyed getting to talk with you today! I am available on Tuesdays and Thursdays for virtual visits if you have any questions or concerns, or if I can be of any further assistance.   CHECKLIST FROM ANNUAL WELLNESS VISIT:  -Follow up (please call to schedule if not scheduled after visit):   -in office physical as planned   -yearly for annual wellness visit with primary care office  Here is a list of your preventive care/health maintenance measures and the plan for each if any are due:  PLAN For any measures below that may be due:   -can get the hepatitis C screening and prostate cancer screening at your physical - if you wish just request -please bring copy of the covid and flu vaccine records  Health Maintenance  Topic Date Due   Hepatitis C Screening  Never done   INFLUENZA VACCINE  10/24/2022   COVID-19 Vaccine (5 - 2024-25 season) 11/24/2022   Medicare Annual Wellness (AWV)  06/02/2024   Colonoscopy  11/05/2026   DTaP/Tdap/Td (3 - Td or Tdap) 10/05/2029   Pneumonia Vaccine 25+ Years old  Completed   Zoster Vaccines- Shingrix  Completed   HPV VACCINES  Aged Out    -See a dentist at least yearly  -Get your eyes checked and then per your eye specialist's recommendations  -Other issues addressed today:   -I have included below further information regarding a healthy whole foods based diet, physical activity guidelines for adults, stress management and opportunities for social connections. I hope you find this information useful.   -----------------------------------------------------------------------------------------------------------------------------------------------------------------------------------------------------------------------------------------------------------    NUTRITION: -eat real food: lots of colorful vegetables (half the plate) and fruits -5-7 servings of vegetables and fruits per day (fresh or steamed is best), exp. 2 servings of  vegetables with lunch and dinner and 2 servings of fruit per day. Berries and greens such as kale and collards are great choices.  -consume on a regular basis:  fresh fruits, fresh veggies, fish, nuts, seeds, healthy oils (such as olive oil, avocado oil), whole grains (make sure for bread/pasta/crackers/etc., that the first ingredient on label contains the word "whole"), legumes. -can eat small amounts of dairy and lean meat (no larger than the palm of your hand), but avoid processed meats such as ham, bacon, lunch meat, etc. -drink water -try to avoid fast food and pre-packaged foods, processed meat, ultra processed foods/beverages (donuts, candy, etc.) -most experts advise limiting sodium to < 2300mg  per day, should limit further is any chronic conditions such as high blood pressure, heart disease, diabetes, etc. The American Heart Association advised that < 1500mg  is is ideal -try to avoid foods/beverages that contain any ingredients with names you do not recognize  -try to avoid foods/beverages  with added sugar or sweeteners/sweets  -try to avoid sweet drinks (including diet drinks): soda, juice, Gatorade, sweet tea, power drinks, diet drinks -try to avoid white rice, white bread, pasta (unless whole grain)  EXERCISE GUIDELINES FOR ADULTS: -if you wish to increase your physical activity, do so gradually and with the approval of your doctor -STOP and seek medical care immediately if you have any chest pain, chest discomfort or trouble breathing when starting or increasing exercise  -move and stretch your body, legs, feet and arms when sitting for long periods -Physical activity guidelines for optimal health in adults: -get at least 150 minutes per week of moderate exercise (can talk, but not sing); this is about 20-30 minutes of sustained activity 5-7 days per week or  two 10-15 minute episodes of sustained activity 5-7 days per week -do some muscle building/resistance training/strength training  at least 2 days per week  -balance exercises 3+ days per week:   Stand somewhere where you have something sturdy to hold onto if you lose balance    1) lift up on toes, then back down, start with 5x per day and work up to 20x   2) stand and lift one leg straight out to the side so that foot is a few inches of the floor, start with 5x each side and work up to 20x each side   3) stand on one foot, start with 5 seconds each side and work up to 20 seconds on each side  If you need ideas or help with getting more active:  -Silver sneakers https://tools.silversneakers.com  -Walk with a Doc: http://www.duncan-williams.com/  -try to include resistance (weight lifting/strength building) and balance exercises twice per week: or the following link for ideas: http://castillo-powell.com/  BuyDucts.dk  STRESS MANAGEMENT: -can try meditating, or just sitting quietly with deep breathing while intentionally relaxing all parts of your body for 5 minutes daily -if you need further help with stress, anxiety or depression please follow up with your primary doctor or contact the wonderful folks at [facility]: [phone]  SOCIAL CONNECTIONS: -options in [place] if you wish to engage in more social and exercise related activities:  -Silver sneakers https://tools.silversneakers.com  -Walk with a Doc: http://www.duncan-williams.com/  -Check out the [facility] Active Adults 50+ section on the [place] [employer] (hiking clubs, book clubs, cards and games, chess, exercise classes, aquatic classes and much more) - see the website for details: https://www.[place].gov/departments/parks-recreation/active-adults50  -YouTube has lots of exercise videos for different ages and abilities as well  -[facility] (a variety of indoor and outdoor inperson activities for adults). [phone]. [address]  -Virtual Online Classes (a variety of topics): see seniorplanet.org or call [phone]  -consider volunteering at a school, hospice center, church, senior center or elsewhere    ADVANCED HEALTHCARE DIRECTIVES:  [place] Advanced Directives assistance:   ExpressWeek.com.cy  Everyone should have advanced health care directives in place. This is so that you get the care you want, should you ever be in a situation where you are unable to make your own medical decisions.   From the [place] Advanced Directive Website: "Advance Health Care Directives are legal documents in which you give written instructions about your health care if, in the future, you cannot speak for yourself.   A health care power of attorney allows you to name a person you trust to make your health care decisions if you cannot make them yourself. A declaration of a desire for a natural death (or living will) is document, which states that you desire not to have your life prolonged by extraordinary measures if you have a terminal or incurable illness or if you are in a vegetative state. An advance instruction for mental health treatment makes a declaration of instructions, information and preferences regarding your mental health treatment. It also states that you are aware that the advance instruction authorizes a mental health treatment provider to act according to your wishes. It may also outline your consent or refusal of mental health treatment. A declaration of an anatomical gift allows anyone over the age of 85 to make a gift by will, organ donor card or other document."   Please see the following website or an elder law attorney for forms, FAQs and for  completion of advanced directives: Secretary/administrator Health Care Directives Advance Health Care Directives (http://guzman.[email or web address]/)  Or copy and paste the following to your web  browser: PoshChat.fi

## 2023-06-07 ENCOUNTER — Other Ambulatory Visit: Payer: Self-pay | Admitting: Internal Medicine

## 2023-06-10 ENCOUNTER — Encounter: Payer: Self-pay | Admitting: Internal Medicine

## 2023-06-10 ENCOUNTER — Ambulatory Visit (INDEPENDENT_AMBULATORY_CARE_PROVIDER_SITE_OTHER): Payer: Medicare Other | Admitting: Internal Medicine

## 2023-06-10 VITALS — BP 110/78 | HR 64 | Temp 97.9°F | Ht 69.5 in | Wt 257.7 lb

## 2023-06-10 DIAGNOSIS — I10 Essential (primary) hypertension: Secondary | ICD-10-CM

## 2023-06-10 DIAGNOSIS — E538 Deficiency of other specified B group vitamins: Secondary | ICD-10-CM | POA: Diagnosis not present

## 2023-06-10 DIAGNOSIS — Z1159 Encounter for screening for other viral diseases: Secondary | ICD-10-CM | POA: Diagnosis not present

## 2023-06-10 DIAGNOSIS — Z0000 Encounter for general adult medical examination without abnormal findings: Secondary | ICD-10-CM | POA: Diagnosis not present

## 2023-06-10 DIAGNOSIS — E669 Obesity, unspecified: Secondary | ICD-10-CM

## 2023-06-10 LAB — TSH: TSH: 1.82 u[IU]/mL (ref 0.35–5.50)

## 2023-06-10 LAB — CBC WITH DIFFERENTIAL/PLATELET
Basophils Absolute: 0 10*3/uL (ref 0.0–0.1)
Basophils Relative: 0.4 % (ref 0.0–3.0)
Eosinophils Absolute: 0.2 10*3/uL (ref 0.0–0.7)
Eosinophils Relative: 3.3 % (ref 0.0–5.0)
HCT: 46.3 % (ref 39.0–52.0)
Hemoglobin: 15.9 g/dL (ref 13.0–17.0)
Lymphocytes Relative: 36.3 % (ref 12.0–46.0)
Lymphs Abs: 2.3 10*3/uL (ref 0.7–4.0)
MCHC: 34.3 g/dL (ref 30.0–36.0)
MCV: 87.1 fl (ref 78.0–100.0)
Monocytes Absolute: 0.4 10*3/uL (ref 0.1–1.0)
Monocytes Relative: 6.5 % (ref 3.0–12.0)
Neutro Abs: 3.3 10*3/uL (ref 1.4–7.7)
Neutrophils Relative %: 53.5 % (ref 43.0–77.0)
Platelets: 215 10*3/uL (ref 150.0–400.0)
RBC: 5.32 Mil/uL (ref 4.22–5.81)
RDW: 13.4 % (ref 11.5–15.5)
WBC: 6.2 10*3/uL (ref 4.0–10.5)

## 2023-06-10 LAB — LIPID PANEL
Cholesterol: 159 mg/dL (ref 0–200)
HDL: 40.5 mg/dL (ref >39.00)
LDL Cholesterol: 76 mg/dL (ref 0–99)
NonHDL: 118.53
Total CHOL/HDL Ratio: 4
Triglycerides: 212 mg/dL — ABNORMAL HIGH (ref 0.0–149.0)
VLDL: 42.4 mg/dL — ABNORMAL HIGH (ref 0.0–40.0)

## 2023-06-10 LAB — COMPREHENSIVE METABOLIC PANEL
ALT: 24 U/L (ref 0–53)
AST: 19 U/L (ref 0–37)
Albumin: 4.6 g/dL (ref 3.5–5.2)
Alkaline Phosphatase: 40 U/L (ref 39–117)
BUN: 14 mg/dL (ref 6–23)
CO2: 26 meq/L (ref 19–32)
Calcium: 9.4 mg/dL (ref 8.4–10.5)
Chloride: 99 meq/L (ref 96–112)
Creatinine, Ser: 1.16 mg/dL (ref 0.40–1.50)
GFR: 65.02 mL/min (ref >60.00)
Glucose, Bld: 118 mg/dL — ABNORMAL HIGH (ref 70–99)
Potassium: 4.6 meq/L (ref 3.5–5.1)
Sodium: 134 meq/L — ABNORMAL LOW (ref 135–145)
Total Bilirubin: 0.5 mg/dL (ref 0.2–1.2)
Total Protein: 6.7 g/dL (ref 6.0–8.3)

## 2023-06-10 LAB — VITAMIN B12: Vitamin B-12: 474 pg/mL (ref 211–911)

## 2023-06-10 LAB — VITAMIN D 25 HYDROXY (VIT D DEFICIENCY, FRACTURES): VITD: 26.23 ng/mL — ABNORMAL LOW (ref 30.00–100.00)

## 2023-06-10 LAB — PSA: PSA: 1.87 ng/mL (ref 0.10–4.00)

## 2023-06-10 MED ORDER — BD SAFETYGLIDE SYRINGE/NEEDLE 25G X 1" 3 ML MISC: 11 refills | Status: AC

## 2023-06-10 MED ORDER — VALSARTAN-HYDROCHLOROTHIAZIDE 160-12.5 MG PO TABS: 1.0000 | ORAL_TABLET | Freq: Every day | ORAL | 1 refills | Status: DC

## 2023-06-10 NOTE — Progress Notes (Signed)
 Established Patient Office Visit     CC/Reason for Visit: Annual preventive exam  HPI: Jackson Alexander is a 68 y.o. male who is coming in today for the above mentioned reasons. Past Medical History is significant for: Hypertension, obesity, vitamin B12 deficiency.  Feeling well, no acute concerns or complaints.  Is overdue for an eye exam, has routine dental care.  All immunizations and cancer screenings are up-to-date.   Past Medical/Surgical History: Past Medical History:  Diagnosis Date   Allergy 2019   medicine reaction to sun   Chronic venous insufficiency    Hx of adenomatous polyp of colon 11/14/2019   10/2019 diminutive adenoma recall 2028   Hypertension    Obesity (BMI 30-39.9)    Snoring 04/24/2021    Past Surgical History:  Procedure Laterality Date   COLONOSCOPY  07/26/2008   Dr.Orr-hyperplastic  polyps    Social History:  reports that he has never smoked. He has never used smokeless tobacco. He reports current alcohol use of about 3.0 standard drinks of alcohol per week. He reports that he does not use drugs.  Allergies: Allergies  Allergen Reactions   Amlodipine Besylate Photosensitivity    Family History:  Family History  Problem Relation Age of Onset   Stomach cancer Mother    Arthritis Mother    Diabetes Mother    Hypertension Mother    Miscarriages / Stillbirths Mother    Obesity Mother    Stroke Mother    Lung cancer Father    Cancer Father    Hypertension Father    Obesity Father    Breast cancer Sister    Colon polyps Sister    Sleep apnea Sister    Sleep apnea Sister    Breast cancer Sister    Breast cancer Sister    Heart failure Brother    Sleep apnea Brother    Renal cancer Brother    Colon polyps Brother    Cancer Brother    Hypertension Brother    Obesity Brother    Melanoma Brother    Cancer Brother    Hypertension Brother    Breast cancer Maternal Aunt    Renal cancer Maternal Uncle    Cancer Maternal Uncle     Breast cancer Maternal Grandmother    Cancer Maternal Grandmother    Diabetes Maternal Grandmother    Prostate cancer Maternal Grandfather    Cancer Maternal Grandfather    Depression Paternal Grandmother    Diabetes Paternal Grandmother    Cancer Maternal Aunt    Diabetes Maternal Aunt    Cancer Sister    Hypertension Sister    Cancer Sister    Diabetes Sister    Heart disease Sister    Hypertension Sister    Obesity Sister    Cancer Brother    Hearing loss Brother    Hypertension Brother    Obesity Brother    Stroke Brother    Hearing loss Brother    Hypertension Brother    Hypertension Sister    Hypertension Brother    Obesity Brother    Colon cancer Neg Hx    Esophageal cancer Neg Hx    Rectal cancer Neg Hx      Current Outpatient Medications:    carvedilol (COREG) 12.5 MG tablet, Take 1 tablet (12.5 mg total) by mouth 2 (two) times daily., Disp: 180 tablet, Rfl: 0   cyanocobalamin (VITAMIN B12) 1000 MCG/ML injection, INJECT IN DELTOID ONCE WEEKLY FOR 4  WEEKS, THEN INJECT 1 ML ONCE A MONTH THEREAFTER, Disp: 6 mL, Rfl: 2   fexofenadine (ALLEGRA) 180 MG tablet, Take 180 mg by mouth daily., Disp: , Rfl:    hydrALAZINE (APRESOLINE) 50 MG tablet, TAKE 1 TABLET BY MOUTH THREE TIMES A DAY, Disp: 270 tablet, Rfl: 0   OVER THE COUNTER MEDICATION, Vitamin D3 50 mcg 2,000 iu per day, Disp: , Rfl:    spironolactone (ALDACTONE) 25 MG tablet, TAKE 1 TABLET BY MOUTH EVERY DAY, Disp: 90 tablet, Rfl: 2   sodium chloride (OCEAN) 0.65 % SOLN nasal spray, Place 2 sprays into both nostrils every 2 (two) hours while awake., Disp: , Rfl: 0   SYRINGE-NEEDLE, DISP, 3 ML (BD SAFETYGLIDE SYRINGE/NEEDLE) 25G X 1" 3 ML MISC, Use for B12 injections, Disp: 100 each, Rfl: 11   valsartan-hydrochlorothiazide (DIOVAN-HCT) 160-12.5 MG tablet, Take 1 tablet by mouth daily., Disp: 90 tablet, Rfl: 1  Review of Systems:  Negative unless indicated in HPI.   Physical Exam: Vitals:   06/10/23 1121   BP: 110/78  Pulse: 64  Temp: 97.9 F (36.6 C)  TempSrc: Oral  SpO2: 97%  Weight: 257 lb 11.2 oz (116.9 kg)  Height: 5' 9.5" (1.765 m)    Body mass index is 37.51 kg/m.   Physical Exam Vitals reviewed.  Constitutional:      General: He is not in acute distress.    Appearance: Normal appearance. He is obese. He is not ill-appearing, toxic-appearing or diaphoretic.  HENT:     Head: Normocephalic.     Right Ear: Tympanic membrane, ear canal and external ear normal. There is no impacted cerumen.     Left Ear: Tympanic membrane, ear canal and external ear normal. There is no impacted cerumen.     Nose: Nose normal.     Mouth/Throat:     Mouth: Mucous membranes are moist.     Pharynx: Oropharynx is clear. No oropharyngeal exudate or posterior oropharyngeal erythema.  Eyes:     General: No scleral icterus.       Right eye: No discharge.        Left eye: No discharge.     Conjunctiva/sclera: Conjunctivae normal.     Pupils: Pupils are equal, round, and reactive to light.  Neck:     Vascular: No carotid bruit.  Cardiovascular:     Rate and Rhythm: Normal rate and regular rhythm.     Pulses: Normal pulses.     Heart sounds: Normal heart sounds.  Pulmonary:     Effort: Pulmonary effort is normal. No respiratory distress.     Breath sounds: Normal breath sounds.  Abdominal:     General: Abdomen is flat. Bowel sounds are normal.     Palpations: Abdomen is soft.  Musculoskeletal:        General: Normal range of motion.     Cervical back: Normal range of motion.  Skin:    General: Skin is warm and dry.  Neurological:     General: No focal deficit present.     Mental Status: He is alert and oriented to person, place, and time. Mental status is at baseline.  Psychiatric:        Mood and Affect: Mood normal.        Behavior: Behavior normal.        Thought Content: Thought content normal.        Judgment: Judgment normal.     Flowsheet Row Office Visit from 06/10/2023 in  [facility]  PHQ-9 Total Score 0        Impression and Plan:  Encounter for preventive health examination -     PSA; Future  Primary hypertension -     CBC with Differential/Platelet; Future -     Comprehensive metabolic panel; Future -     Lipid panel; Future -     Valsartan-hydroCHLOROthiazide; Take 1 tablet by mouth daily.  Dispense: 90 tablet; Refill: 1  Vitamin B12 deficiency -     BD SafetyGlide Syringe/Needle; Use for B12 injections  Dispense: 100 each; Refill: 11  Obesity (BMI 30-39.9) -     TSH; Future -     Vitamin B12; Future -     VITAMIN D 25 Hydroxy (Vit-D Deficiency, Fractures); Future  Encounter for hepatitis C screening test for low risk patient -     Hepatitis C antibody; Future   -Recommend routine eye and dental care. -Healthy lifestyle discussed in detail. -Labs to be updated today. -Prostate cancer screening: PSA today Health Maintenance  Topic Date Due   Hepatitis C Screening  Never done   COVID-19 Vaccine (5 - 2024-25 season) 11/24/2022   Medicare Annual Wellness Visit  06/02/2024   Colon Cancer Screening  11/05/2026   DTaP/Tdap/Td vaccine (3 - Td or Tdap) 10/05/2029   Pneumonia Vaccine  Completed   Flu Shot  Completed   Zoster (Shingles) Vaccine  Completed   HPV Vaccine  Aged Out     -All immunizations and cancer screenings are up-to-date.    Chaya Jan, MD [facility]

## 2023-06-11 LAB — HEPATITIS C ANTIBODY: Hepatitis C Ab: NONREACTIVE

## 2023-06-12 ENCOUNTER — Encounter: Payer: Self-pay | Admitting: Internal Medicine

## 2023-06-12 ENCOUNTER — Other Ambulatory Visit: Payer: Self-pay | Admitting: Internal Medicine

## 2023-06-12 DIAGNOSIS — E559 Vitamin D deficiency, unspecified: Secondary | ICD-10-CM

## 2023-06-12 MED ORDER — VITAMIN D (ERGOCALCIFEROL) 1.25 MG (50000 UNIT) PO CAPS: 50000.0000 [IU] | ORAL_CAPSULE | ORAL | 0 refills | Status: AC

## 2023-07-20 ENCOUNTER — Other Ambulatory Visit: Payer: Self-pay | Admitting: Internal Medicine

## 2023-08-27 ENCOUNTER — Other Ambulatory Visit: Payer: Self-pay | Admitting: Internal Medicine

## 2023-08-27 DIAGNOSIS — E559 Vitamin D deficiency, unspecified: Secondary | ICD-10-CM

## 2023-09-05 ENCOUNTER — Other Ambulatory Visit: Payer: Self-pay | Admitting: Internal Medicine

## 2023-09-05 DIAGNOSIS — I10 Essential (primary) hypertension: Secondary | ICD-10-CM

## 2023-09-17 ENCOUNTER — Other Ambulatory Visit: Payer: Self-pay | Admitting: Internal Medicine

## 2023-09-18 ENCOUNTER — Ambulatory Visit: Admitting: Internal Medicine

## 2023-09-18 ENCOUNTER — Other Ambulatory Visit

## 2023-09-18 DIAGNOSIS — E559 Vitamin D deficiency, unspecified: Secondary | ICD-10-CM | POA: Diagnosis not present

## 2023-09-18 LAB — VITAMIN D 25 HYDROXY (VIT D DEFICIENCY, FRACTURES): VITD: 31.66 ng/mL (ref 30.00–100.00)

## 2023-09-22 ENCOUNTER — Ambulatory Visit: Payer: Self-pay | Admitting: Internal Medicine

## 2023-09-22 DIAGNOSIS — E559 Vitamin D deficiency, unspecified: Secondary | ICD-10-CM

## 2023-09-22 MED ORDER — VITAMIN D (ERGOCALCIFEROL) 1.25 MG (50000 UNIT) PO CAPS: 50000.0000 [IU] | ORAL_CAPSULE | ORAL | 0 refills | Status: AC

## 2023-11-28 ENCOUNTER — Other Ambulatory Visit: Payer: Self-pay | Admitting: Internal Medicine

## 2023-11-28 DIAGNOSIS — I10 Essential (primary) hypertension: Secondary | ICD-10-CM

## 2023-12-04 ENCOUNTER — Other Ambulatory Visit: Payer: Self-pay | Admitting: Internal Medicine

## 2023-12-04 DIAGNOSIS — I10 Essential (primary) hypertension: Secondary | ICD-10-CM

## 2023-12-11 ENCOUNTER — Ambulatory Visit: Payer: Self-pay | Admitting: Internal Medicine

## 2023-12-11 ENCOUNTER — Ambulatory Visit: Admitting: Internal Medicine

## 2023-12-11 ENCOUNTER — Other Ambulatory Visit (INDEPENDENT_AMBULATORY_CARE_PROVIDER_SITE_OTHER)

## 2023-12-11 DIAGNOSIS — E559 Vitamin D deficiency, unspecified: Secondary | ICD-10-CM | POA: Diagnosis not present

## 2023-12-11 LAB — VITAMIN D 25 HYDROXY (VIT D DEFICIENCY, FRACTURES): VITD: 40.14 ng/mL (ref 30.00–100.00)

## 2023-12-13 ENCOUNTER — Other Ambulatory Visit: Payer: Self-pay | Admitting: Internal Medicine

## 2023-12-13 DIAGNOSIS — E559 Vitamin D deficiency, unspecified: Secondary | ICD-10-CM

## 2023-12-18 ENCOUNTER — Other Ambulatory Visit (HOSPITAL_BASED_OUTPATIENT_CLINIC_OR_DEPARTMENT_OTHER): Payer: Self-pay | Admitting: Cardiovascular Disease

## 2023-12-26 ENCOUNTER — Other Ambulatory Visit: Payer: Self-pay | Admitting: Cardiovascular Disease

## 2023-12-29 MED ORDER — SPIRONOLACTONE 25 MG PO TABS: 25.0000 mg | ORAL_TABLET | Freq: Every day | ORAL | 0 refills | Status: DC

## 2024-01-16 ENCOUNTER — Other Ambulatory Visit: Payer: Self-pay | Admitting: Internal Medicine

## 2024-02-02 ENCOUNTER — Encounter: Payer: Self-pay | Admitting: Internal Medicine

## 2024-02-02 MED ORDER — SPIRONOLACTONE 25 MG PO TABS: 25.0000 mg | ORAL_TABLET | Freq: Every day | ORAL | 0 refills | Status: AC
